# Patient Record
Sex: Female | Born: 1980 | Race: Black or African American | Hispanic: No | Marital: Married | Smoking: Never smoker
Health system: Southern US, Community
[De-identification: ages and names within clinical notes are randomized; demographics above are authoritative.]

## PROBLEM LIST (undated history)

## (undated) DIAGNOSIS — E041 Nontoxic single thyroid nodule: Secondary | ICD-10-CM

## (undated) DIAGNOSIS — D219 Benign neoplasm of connective and other soft tissue, unspecified: Secondary | ICD-10-CM

## (undated) DIAGNOSIS — E039 Hypothyroidism, unspecified: Secondary | ICD-10-CM

## (undated) HISTORY — DX: Nontoxic single thyroid nodule: E04.1

## (undated) HISTORY — PX: MYOMECTOMY: SHX85

## (undated) HISTORY — PX: WISDOM TOOTH EXTRACTION: SHX21

## (undated) HISTORY — PX: HYSTEROSCOPY: SHX211

---

## 2003-04-12 ENCOUNTER — Other Ambulatory Visit: Admission: RE | Admit: 2003-04-12 | Discharge: 2003-04-12 | Payer: Self-pay | Admitting: Obstetrics and Gynecology

## 2004-06-27 ENCOUNTER — Other Ambulatory Visit: Admission: RE | Admit: 2004-06-27 | Discharge: 2004-06-27 | Payer: Self-pay | Admitting: Obstetrics and Gynecology

## 2005-09-02 ENCOUNTER — Other Ambulatory Visit: Admission: RE | Admit: 2005-09-02 | Discharge: 2005-09-02 | Payer: Self-pay | Admitting: Obstetrics and Gynecology

## 2009-12-07 ENCOUNTER — Encounter: Admission: RE | Admit: 2009-12-07 | Discharge: 2009-12-07 | Payer: Self-pay | Admitting: Internal Medicine

## 2010-01-03 ENCOUNTER — Other Ambulatory Visit: Admission: RE | Admit: 2010-01-03 | Discharge: 2010-01-03 | Payer: Self-pay | Admitting: Interventional Radiology

## 2010-01-03 ENCOUNTER — Encounter: Admission: RE | Admit: 2010-01-03 | Discharge: 2010-01-03 | Payer: Self-pay | Admitting: Endocrinology

## 2010-02-06 ENCOUNTER — Ambulatory Visit (HOSPITAL_COMMUNITY): Admission: RE | Admit: 2010-02-06 | Discharge: 2010-02-06 | Payer: Self-pay | Admitting: Obstetrics and Gynecology

## 2010-10-06 LAB — CBC
Hemoglobin: 12.3 g/dL (ref 12.0–15.0)
MCH: 30 pg (ref 26.0–34.0)
MCV: 88.1 fL (ref 78.0–100.0)
RDW: 12.8 % (ref 11.5–15.5)

## 2010-10-10 IMAGING — US US BIOPSY
1 series · 13 of 13 positions shown · non-contrast
Comparison: none

CLINICAL DATA: Dominant right thyroid mass.

ULTRASOUND-GUIDED THYROID ASPIRATION BIOPSY
TECHNIQUE: Survey ultrasound was performed and the dominant lesion
in the right lobe was localized.  An appropriate skin entry site
was determined.  Skin was marked, then prepped with Betadine,
draped in usual sterile fashion, and infiltrated locally with 1%
lidocaine.  Under real-time ultrasound guidance, 4  passes were
made into the lesion with 25 gauge needles.  The patient tolerated
procedure well, with no immediate complications.
IMPRESSION
1.  Technically successful ultrasound-guided thyroid aspiration
biopsy

[Series 1: us biopsy · 0.07mm/px · 13 acquisitions, 13 frames shown]
[im 1/13]
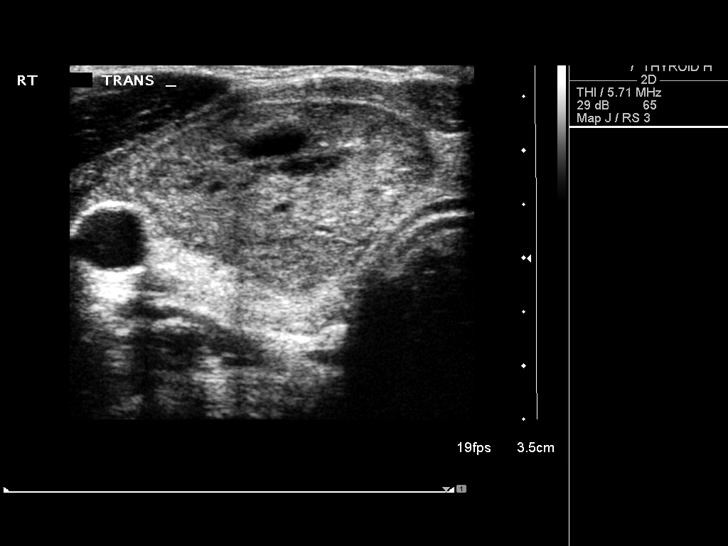
[im 2/13]
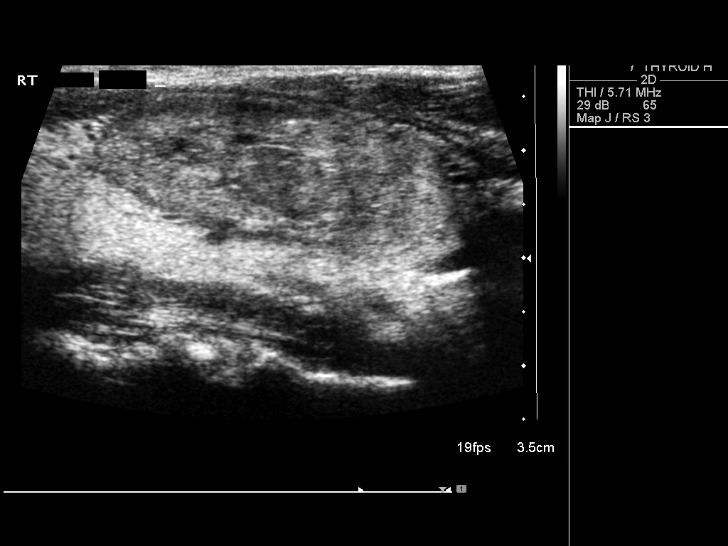
[im 3/13]
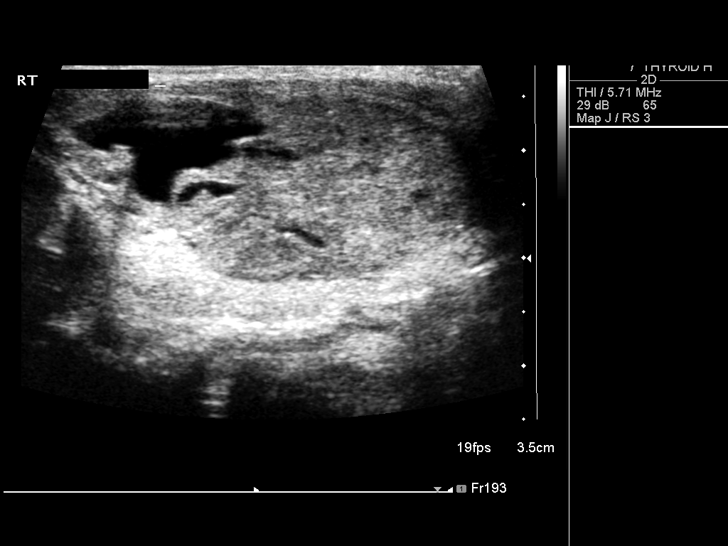
[im 4/13]
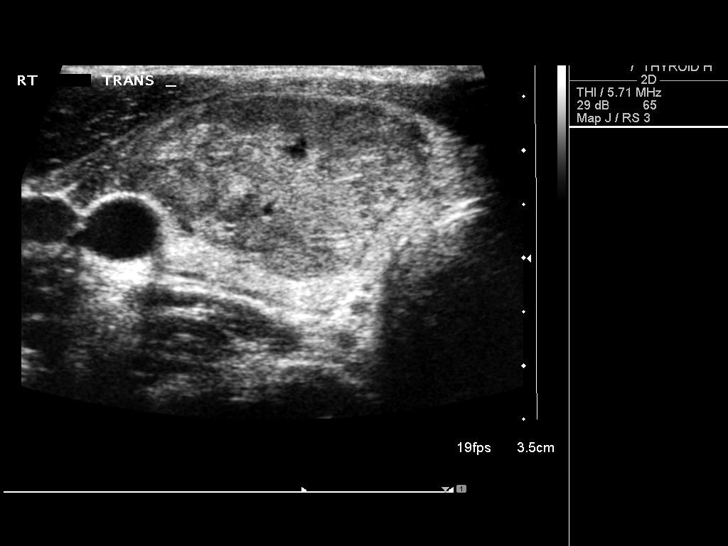
[im 5/13]
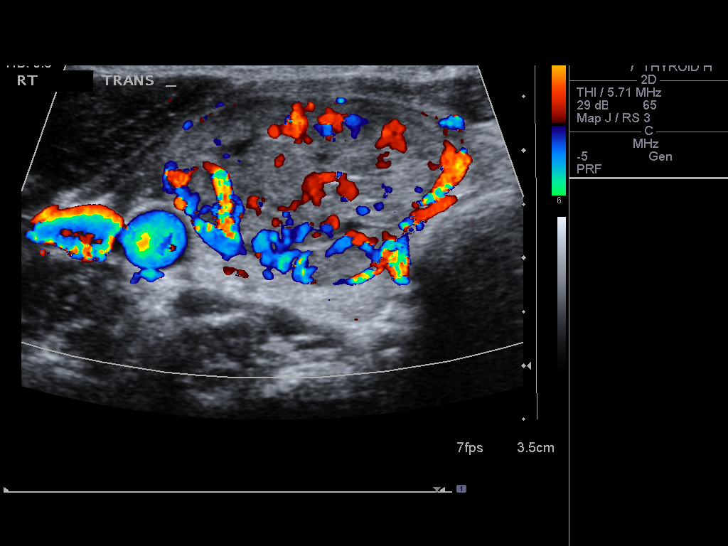
[im 6/13]
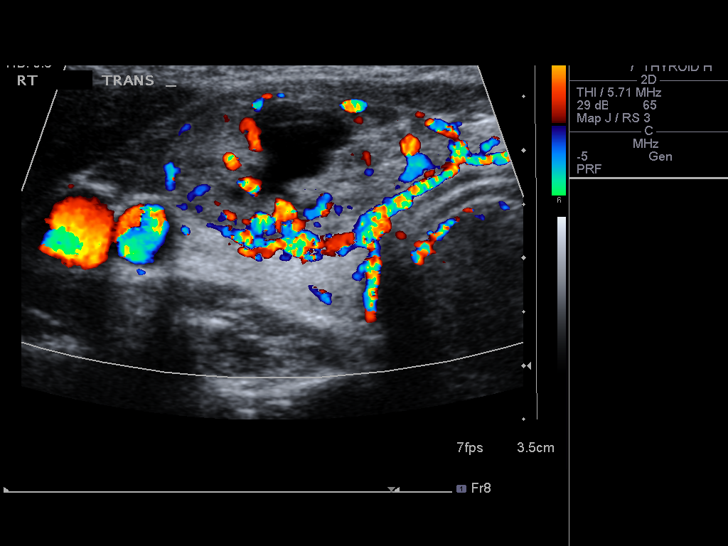
[im 7/13]
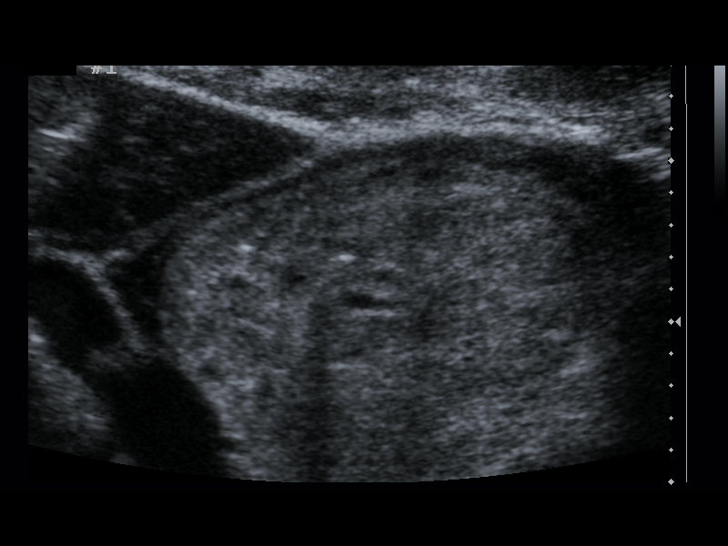
[im 8/13]
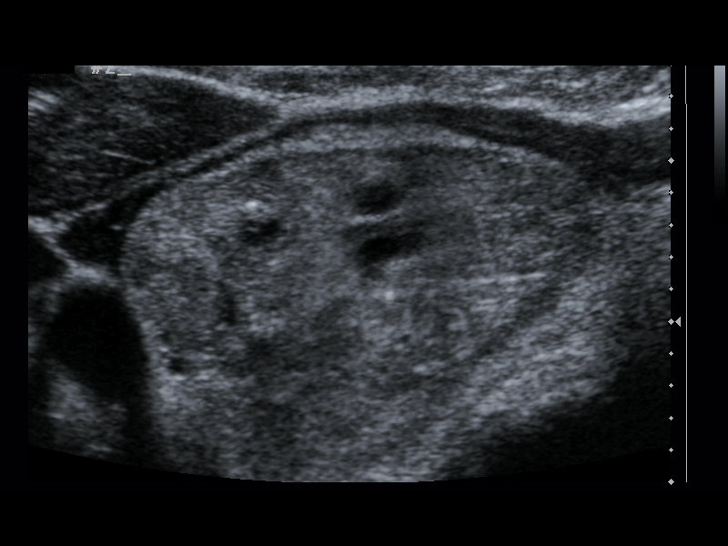
[im 9/13]
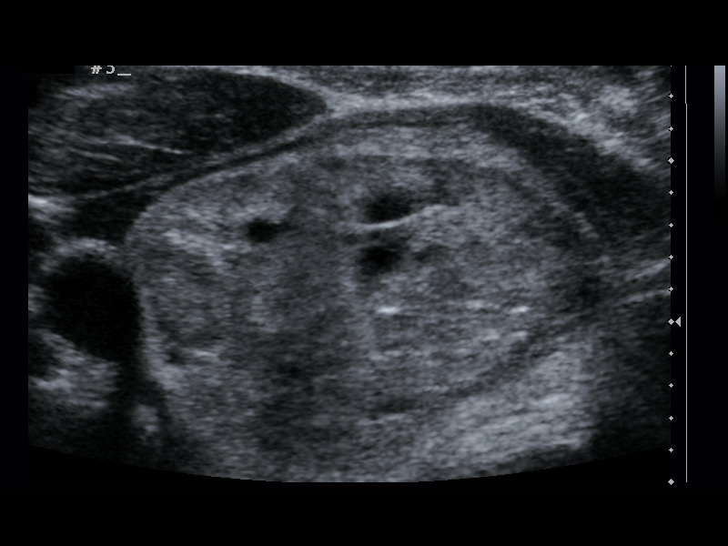
[im 10/13]
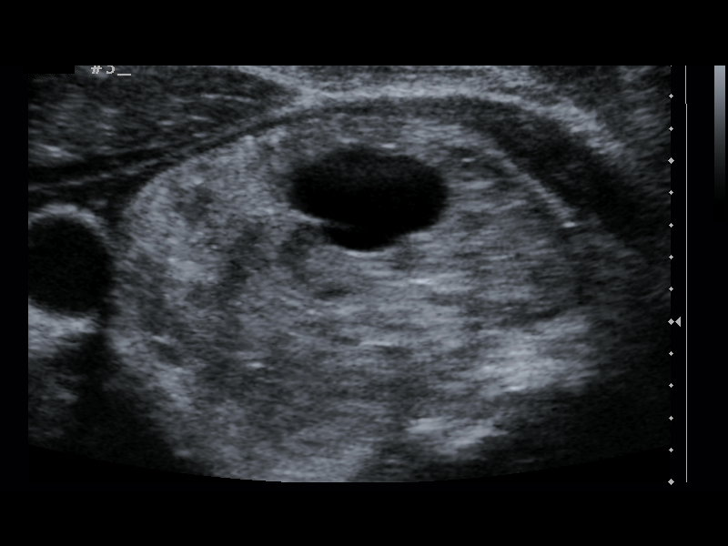
[im 11/13]
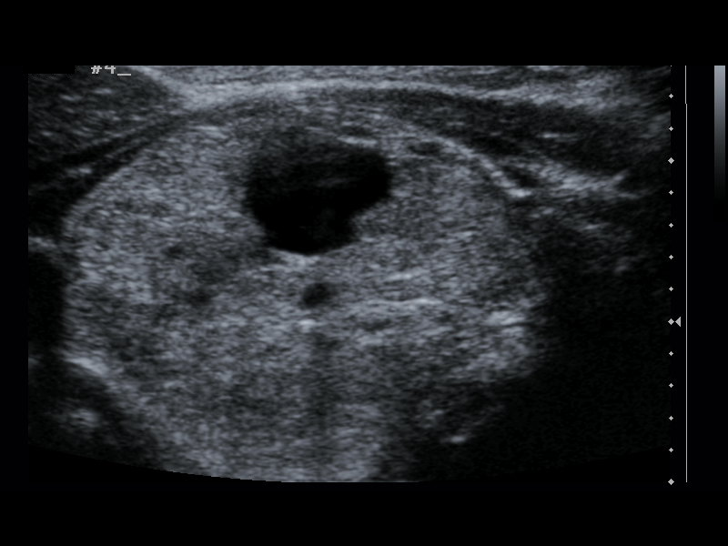
[im 12/13]
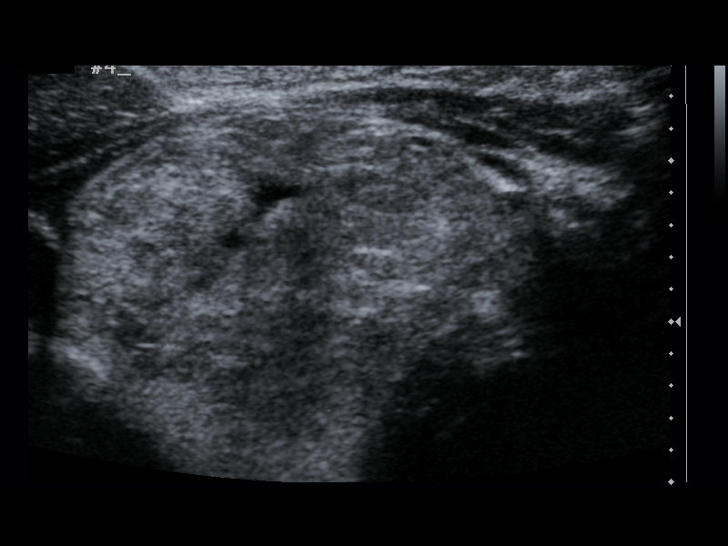
[im 13/13]
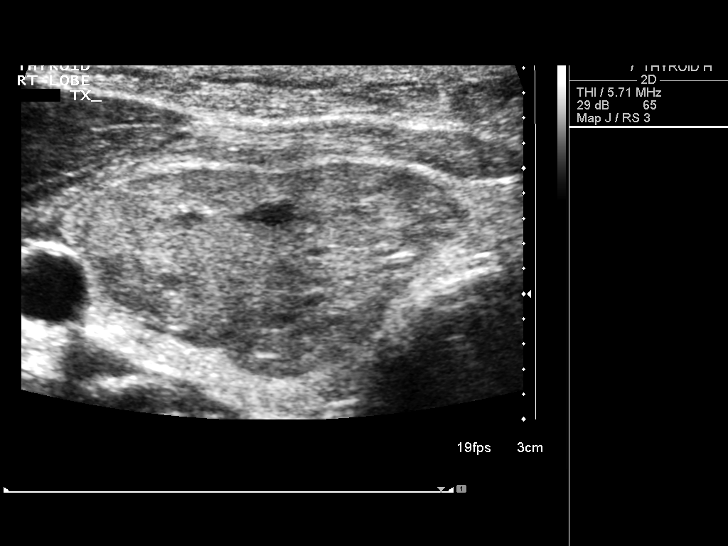

[13 of 13 positions shown; findings below may reference images not displayed]

## 2010-10-30 ENCOUNTER — Other Ambulatory Visit (HOSPITAL_COMMUNITY): Payer: Self-pay | Admitting: Obstetrics and Gynecology

## 2010-10-30 DIAGNOSIS — N979 Female infertility, unspecified: Secondary | ICD-10-CM

## 2010-11-01 ENCOUNTER — Ambulatory Visit (HOSPITAL_COMMUNITY): Admission: RE | Admit: 2010-11-01 | Payer: BLUE CROSS/BLUE SHIELD | Source: Ambulatory Visit

## 2010-11-05 ENCOUNTER — Ambulatory Visit (HOSPITAL_COMMUNITY): Payer: BLUE CROSS/BLUE SHIELD

## 2011-02-05 LAB — CBC
HCT: 38 % (ref 36–46)
Hemoglobin: 13.1 g/dL (ref 12.0–16.0)
Platelets: 266 10*3/uL (ref 150–399)

## 2011-02-05 LAB — ABO/RH: RH Type: POSITIVE

## 2011-02-05 LAB — HIV ANTIBODY (ROUTINE TESTING W REFLEX): HIV: NONREACTIVE

## 2011-02-05 LAB — HEPATITIS B SURFACE ANTIGEN: Hepatitis B Surface Ag: NEGATIVE

## 2011-04-25 ENCOUNTER — Encounter (HOSPITAL_COMMUNITY): Payer: Self-pay | Admitting: *Deleted

## 2011-04-25 ENCOUNTER — Inpatient Hospital Stay (HOSPITAL_COMMUNITY)
Admission: AD | Admit: 2011-04-25 | Discharge: 2011-04-26 | Disposition: A | Discharge status: Home / Self Care | Payer: BC Managed Care – PPO | Source: Ambulatory Visit | Attending: Obstetrics and Gynecology | Admitting: Obstetrics and Gynecology

## 2011-04-25 DIAGNOSIS — O200 Threatened abortion: Secondary | ICD-10-CM | POA: Insufficient documentation

## 2011-04-25 DIAGNOSIS — O479 False labor, unspecified: Secondary | ICD-10-CM

## 2011-04-25 DIAGNOSIS — D219 Benign neoplasm of connective and other soft tissue, unspecified: Secondary | ICD-10-CM

## 2011-04-25 DIAGNOSIS — O3410 Maternal care for benign tumor of corpus uteri, unspecified trimester: Secondary | ICD-10-CM | POA: Insufficient documentation

## 2011-04-25 DIAGNOSIS — D259 Leiomyoma of uterus, unspecified: Secondary | ICD-10-CM | POA: Insufficient documentation

## 2011-04-25 HISTORY — DX: Benign neoplasm of connective and other soft tissue, unspecified: D21.9

## 2011-04-25 HISTORY — DX: Hypothyroidism, unspecified: E03.9

## 2011-04-25 LAB — WET PREP, GENITAL
Trich, Wet Prep: NONE SEEN
Yeast Wet Prep HPF POC: NONE SEEN

## 2011-04-25 LAB — URINALYSIS, ROUTINE W REFLEX MICROSCOPIC
Bilirubin Urine: NEGATIVE
Hgb urine dipstick: NEGATIVE
Ketones, ur: NEGATIVE mg/dL
Protein, ur: NEGATIVE mg/dL
Specific Gravity, Urine: <1.005 — ABNORMAL LOW (ref 1.005–1.030)
pH: 6.5 (ref 5.0–8.0)

## 2011-04-25 MED ORDER — NIFEDIPINE 10 MG PO CAPS: 20.0000 mg | ORAL_CAPSULE | Freq: Once | ORAL | Status: DC

## 2011-04-25 MED ORDER — IBUPROFEN 800 MG PO TABS
800.0000 mg | ORAL_TABLET | Freq: Once | ORAL | Status: AC
  Administered 2011-04-25: 800 mg via ORAL
  Filled 2011-04-25: qty 1

## 2011-04-25 NOTE — Progress Notes (Signed)
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  RESTED PAIN DID NOT GO AWAY- BUT DID SLEEP-    HAS BEEN HAPPENING ALL DAY- HAS LOWER TIGHTENNESS.  CALLED OFFICE AT   11AM -  CALLED BACK IN AFTERNOON- TOLD  TO TAKE 2 TYLENOL AND DRINK WATER.   PAIN IS STILL SAME.

## 2011-04-25 NOTE — Progress Notes (Signed)
Pt in c/o abdominal cramping x24 hours.  Called the office, told to drink water and take tylenol, tried and did not help.  Denies any urinary symptoms other than increased pressure.  + FM.  Denies any bleeding or leaking of fluid.

## 2011-04-25 NOTE — ED Provider Notes (Signed)
History   Hannah Knapp is a 30 y.o. MBF, primagravida at 21.6 wks, who presents reporting lower abdominal, tightening, pulling since about 8pm last night.  Pain is worse with standing.  Had noticed before, but didn't last long and went away; this incident has been persistent over last 24 hrs.  Able to sleep last night, but has noticed pains again today; sometimes constant, but overall, comes and goes.  No VB, abnl d/c, or LOF.  Last IC about 1 week ago.  Called office today and instructed pt to increase water and take 2 Tylenol--pt took around 1600, but no relief; called afterhours and instructed to come in for eval.  Pt works as Associate Professor at [employer]; gets up about every 30 minutes when seeing students to escort them in and out of her office, rest of her time at desk.  No family hx of preterm births.    No chief complaint on file.  HPI  OB History    Grav Para Term Preterm Abortions TAB SAB Ect Mult Living   1               Past Medical History  Diagnosis Date  . Fibroid   . Hypothyroidism     Past Surgical History  Procedure Date  . Hysteroscopy     No family history on file.  History  Substance Use Topics  . Smoking status: Never Smoker   . Smokeless tobacco: Not on file  . Alcohol Use: No    Allergies:  Allergies  Allergen Reactions  . Other Hives and Itching    "Red snapper fish"    Prescriptions prior to admission  Medication Sig Dispense Refill  . acetaminophen (TYLENOL) 325 MG tablet Take 650 mg by mouth every 6 (six) hours as needed. For pain       . ergocalciferol (VITAMIN D2) 50000 UNITS capsule Take 50,000 Units by mouth 2 (two) times a week. Take on Tuesdays and Thursdays       . prenatal vitamin w/FE, FA (PRENATAL 1 + 1) 27-1 MG TABS Take 1 tablet by mouth daily.          Review of Systems  Constitutional: Negative.   Respiratory: Negative.        Cold early September for about "6" days  Cardiovascular: Negative.   Gastrointestinal: Negative.     Last BM this AM; typically daily BM  Genitourinary: Negative.   Musculoskeletal: Negative.   Skin: Negative.   Neurological: Negative.    Physical Exam   Blood pressure 103/68, pulse 90, temperature 99.1 F (37.3 C), temperature source Oral, resp. rate 20, height 5\' 3"  (1.6 m), weight 61.292 kg (135 lb 2 oz).  Physical Exam  Constitutional: She is oriented to person, place, and time. She appears well-developed and well-nourished.       Grimace and guarding lower abdomen with some ctxs; feels urge to void w/ ctxs at times;   Cardiovascular: Normal rate and regular rhythm.   Respiratory: Effort normal and breath sounds normal.  GI: Soft. Bowel sounds are normal.  Genitourinary: Vagina normal.       sm amt white d/c in vault; no lesions; no blood; cx:  L/CL/firm/high  Musculoskeletal: She exhibits no edema.  Neurological: She is alert and oriented to person, place, and time. She has normal reflexes.  Skin: Skin is warm and dry.    MAU Course  Procedures 1.  Wet Prep:  Neg 2.  GC/CT pending 3. U/a:Neg; SG<1.005 4.  TOCO only after initial FHT's and ctxs 4-5 min apart 5.  Motrin 800mg  po x1 at 2125--helped some with discomfort from ctxs, but not frequency overall 6.  Po hydration   Assessment and Plan  1.  IUP at 21.6 2.  Preterm ctxs 3.  large post fibroid =7 cm at 7.5 wk u/s 4.  No s/s of infection 5.  Incomplete anatomy u/s at 19 wks (facial and cardiac views not all seen) 6.  Spacing of ctxs with time after Motrin, but didn't resolve; pt able to sleep after 11pm 7.  Declined tocolytics  1.  Per c/w Dr. Stefano Gaul, Motrin and po fluids given. 2.  Approximately 1.5 hrs after Motrin, c/w dr. Stefano Gaul r/e persisting ctxs, and he rec'd Terbutaline or Procardia, and gave pt options; After discussion of both and how work in body, pt declined and wants to reassess how she feels over next hr or so.  3.  Reassessed pt around MN, ctxs cont'd to space with time, some 2-4, but spaces of  7-8 minutes also noted; Pt requests to go home and be seen at office 04/26/11. 4.  Preterm labor precautions rev'd; pelvic rest; continue Motrin q8hrs x24hrs--Rx called to [facility] [address] 5.  Note written to be OOW until Monday 04/29/11.   Izen Petz H 04/25/2011, 9:34 PM

## 2011-04-26 MED ORDER — IBUPROFEN 800 MG PO TABS: 800.0000 mg | ORAL_TABLET | Freq: Three times a day (TID) | ORAL | Status: AC | PRN

## 2011-07-23 NOTE — L&D Delivery Note (Signed)
Delivery Note At 9:40 AM a viable and healthy female was delivered via Vaginal, Spontaneous Delivery (Presentation: Right Occiput Anterior).  APGAR: 7, 9; weight 8 lb 1.8 oz (3680 g).   Placenta status: Intact, Spontaneous.  Cord: 3 vessels with the following complications: None.  The placenta had an extra lobe. The umbilical cord was inserted at the edge of the largest portion of the placenta. The placenta was sent to pathology.  Anesthesia: Epidural  Episiotomy: None Lacerations: None Suture Repair: NA Est. Blood Loss (mL): 350   Mom to postpartum.   Baby A to nursery-stable.   Baby B to NA.  Hannah Knapp V 09/03/2011, 9:58 AM

## 2011-08-08 LAB — STREP B DNA PROBE
GBS: NEGATIVE
GBS: POSITIVE

## 2011-09-02 ENCOUNTER — Encounter (HOSPITAL_COMMUNITY): Payer: Self-pay | Admitting: Anesthesiology

## 2011-09-02 ENCOUNTER — Inpatient Hospital Stay (HOSPITAL_COMMUNITY)
Admission: RE | Admit: 2011-09-02 | Discharge: 2011-09-05 | DRG: 373 | Disposition: A | Discharge status: Home / Self Care | Payer: BC Managed Care – PPO | Source: Ambulatory Visit | Attending: Obstetrics and Gynecology | Admitting: Obstetrics and Gynecology

## 2011-09-02 ENCOUNTER — Other Ambulatory Visit: Payer: Self-pay | Admitting: Obstetrics and Gynecology

## 2011-09-02 ENCOUNTER — Inpatient Hospital Stay (HOSPITAL_COMMUNITY): Payer: BC Managed Care – PPO | Admitting: Anesthesiology

## 2011-09-02 DIAGNOSIS — Y9210 Unspecified residential institution as the place of occurrence of the external cause: Secondary | ICD-10-CM | POA: Diagnosis not present

## 2011-09-02 DIAGNOSIS — Z22330 Carrier of Group B streptococcus: Secondary | ICD-10-CM

## 2011-09-02 DIAGNOSIS — O34599 Maternal care for other abnormalities of gravid uterus, unspecified trimester: Secondary | ICD-10-CM | POA: Diagnosis present

## 2011-09-02 DIAGNOSIS — O99892 Other specified diseases and conditions complicating childbirth: Principal | ICD-10-CM | POA: Diagnosis present

## 2011-09-02 DIAGNOSIS — D259 Leiomyoma of uterus, unspecified: Secondary | ICD-10-CM | POA: Diagnosis present

## 2011-09-02 DIAGNOSIS — D4959 Neoplasm of unspecified behavior of other genitourinary organ: Secondary | ICD-10-CM | POA: Diagnosis present

## 2011-09-02 DIAGNOSIS — E039 Hypothyroidism, unspecified: Secondary | ICD-10-CM | POA: Diagnosis present

## 2011-09-02 DIAGNOSIS — E079 Disorder of thyroid, unspecified: Secondary | ICD-10-CM | POA: Diagnosis present

## 2011-09-02 DIAGNOSIS — W19XXXA Unspecified fall, initial encounter: Secondary | ICD-10-CM | POA: Diagnosis not present

## 2011-09-02 DIAGNOSIS — O99284 Endocrine, nutritional and metabolic diseases complicating childbirth: Secondary | ICD-10-CM | POA: Diagnosis present

## 2011-09-02 LAB — CBC
MCHC: 33.8 g/dL (ref 30.0–36.0)
MCV: 91.1 fL (ref 78.0–100.0)
Platelets: 178 10*3/uL (ref 150–400)
RDW: 13.2 % (ref 11.5–15.5)
WBC: 9.5 10*3/uL (ref 4.0–10.5)

## 2011-09-02 MED ORDER — PHENYLEPHRINE 40 MCG/ML (10ML) SYRINGE FOR IV PUSH (FOR BLOOD PRESSURE SUPPORT): 80.0000 ug | PREFILLED_SYRINGE | INTRAVENOUS | Status: DC | PRN

## 2011-09-02 MED ORDER — LACTATED RINGERS IV SOLN: 500.0000 mL | Freq: Once | INTRAVENOUS | Status: DC

## 2011-09-02 MED ORDER — OXYCODONE-ACETAMINOPHEN 5-325 MG PO TABS: 1.0000 | ORAL_TABLET | ORAL | Status: DC | PRN

## 2011-09-02 MED ORDER — PENICILLIN G POTASSIUM 5000000 UNITS IJ SOLR
5.0000 10*6.[IU] | Freq: Once | INTRAVENOUS | Status: AC
  Administered 2011-09-02: 5 10*6.[IU] via INTRAVENOUS
  Filled 2011-09-02: qty 5

## 2011-09-02 MED ORDER — EPHEDRINE 5 MG/ML INJ: 10.0000 mg | INTRAVENOUS | Status: DC | PRN

## 2011-09-02 MED ORDER — IBUPROFEN 600 MG PO TABS: 600.0000 mg | ORAL_TABLET | Freq: Four times a day (QID) | ORAL | Status: DC | PRN

## 2011-09-02 MED ORDER — LACTATED RINGERS IV SOLN
INTRAVENOUS | Status: DC
  Administered 2011-09-02 – 2011-09-03 (×3): via INTRAVENOUS

## 2011-09-02 MED ORDER — LIDOCAINE HCL (PF) 1 % IJ SOLN: 30.0000 mL | INTRAMUSCULAR | Status: DC | PRN

## 2011-09-02 MED ORDER — LIDOCAINE HCL (PF) 1 % IJ SOLN
INTRAMUSCULAR | Status: DC | PRN
  Administered 2011-09-02 (×2): 5 mL

## 2011-09-02 MED ORDER — OXYTOCIN BOLUS FROM INFUSION
500.0000 mL | Freq: Once | INTRAVENOUS | Status: AC
  Administered 2011-09-03: 500 mL via INTRAVENOUS
  Filled 2011-09-02: qty 1000
  Filled 2011-09-02: qty 500

## 2011-09-02 MED ORDER — FENTANYL 2.5 MCG/ML BUPIVACAINE 1/10 % EPIDURAL INFUSION (WH - ANES)
INTRAMUSCULAR | Status: DC | PRN
  Administered 2011-09-02: 14 mL/h via EPIDURAL

## 2011-09-02 MED ORDER — FENTANYL 2.5 MCG/ML BUPIVACAINE 1/10 % EPIDURAL INFUSION (WH - ANES)
14.0000 mL/h | INTRAMUSCULAR | Status: DC
  Administered 2011-09-02 – 2011-09-03 (×3): 14 mL/h via EPIDURAL
  Filled 2011-09-02 (×4): qty 60

## 2011-09-02 MED ORDER — BUTORPHANOL TARTRATE 2 MG/ML IJ SOLN: 1.0000 mg | INTRAMUSCULAR | Status: DC | PRN

## 2011-09-02 MED ORDER — PHENYLEPHRINE 40 MCG/ML (10ML) SYRINGE FOR IV PUSH (FOR BLOOD PRESSURE SUPPORT)
80.0000 ug | PREFILLED_SYRINGE | INTRAVENOUS | Status: DC | PRN
  Filled 2011-09-02: qty 5

## 2011-09-02 MED ORDER — FLEET ENEMA 7-19 GM/118ML RE ENEM: 1.0000 | ENEMA | RECTAL | Status: DC | PRN

## 2011-09-02 MED ORDER — EPHEDRINE 5 MG/ML INJ
10.0000 mg | INTRAVENOUS | Status: DC | PRN
  Administered 2011-09-02: 10 mg via INTRAVENOUS
  Filled 2011-09-02: qty 4

## 2011-09-02 MED ORDER — CITRIC ACID-SODIUM CITRATE 334-500 MG/5ML PO SOLN: 30.0000 mL | ORAL | Status: DC | PRN

## 2011-09-02 MED ORDER — OXYTOCIN 20 UNITS IN LACTATED RINGERS INFUSION - SIMPLE: 125.0000 mL/h | Freq: Once | INTRAVENOUS | Status: DC

## 2011-09-02 MED ORDER — PENICILLIN G POTASSIUM 5000000 UNITS IJ SOLR
2.5000 10*6.[IU] | INTRAVENOUS | Status: DC
  Administered 2011-09-02 – 2011-09-03 (×4): 2.5 10*6.[IU] via INTRAVENOUS
  Filled 2011-09-02 (×7): qty 2.5

## 2011-09-02 MED ORDER — DIPHENHYDRAMINE HCL 50 MG/ML IJ SOLN: 12.5000 mg | INTRAMUSCULAR | Status: DC | PRN

## 2011-09-02 MED ORDER — ACETAMINOPHEN 325 MG PO TABS: 650.0000 mg | ORAL_TABLET | ORAL | Status: DC | PRN

## 2011-09-02 MED ORDER — LACTATED RINGERS IV SOLN
500.0000 mL | INTRAVENOUS | Status: DC | PRN
  Administered 2011-09-02 – 2011-09-03 (×3): 300 mL via INTRAVENOUS

## 2011-09-02 MED ORDER — ONDANSETRON HCL 4 MG/2ML IJ SOLN: 4.0000 mg | Freq: Four times a day (QID) | INTRAMUSCULAR | Status: DC | PRN

## 2011-09-02 NOTE — Progress Notes (Addendum)
Hannah Knapp is a 31 y.o. G1P0 at [redacted]w[redacted]d admitted in early labor.  Subjective: No complaints.  Objective: BP 127/77  Pulse 86  Temp(Src) 98.8 F (37.1 C) (Oral)  Resp 20  Ht 5' 2.5" (1.588 m)  Wt 69.4 kg (153 lb)  BMI 27.54 kg/m2  SpO2 100%      FHT:  FHR: 120s-130s bpm, variability: moderate,  accelerations:  Present,  decelerations:  Absent UC:   irregular, every 3-6 minutes SVE:   Dilation: 4.5 Effacement (%): 100 Station: -1 Exam by:: Erline Hau RNC SROM clear fluid  Labs: Lab Results  Component Value Date   WBC 9.5 09/02/2011   HGB 11.8* 09/02/2011   HCT 34.9* 09/02/2011   MCV 91.1 09/02/2011   PLT 178 09/02/2011    Assessment / Plan: Active labor and progressing.  Currently getting epidural.  Fetal status is overall reassuring.  Purcell Nails 09/02/2011, 7:28 PM

## 2011-09-02 NOTE — Progress Notes (Signed)
Hannah Knapp is a 31 y.o. G1P0 at [redacted]w[redacted]d admitted for early labor.   Subjective: No complaints.  Notified by RN of decel which has since recovered with position and and O2.  Objective: BP 106/68  Pulse 87  Temp(Src) 98.9 F (37.2 C) (Oral)  Resp 20  Ht 5' 2.5" (1.588 m)  Wt 69.4 kg (153 lb)  BMI 27.54 kg/m2  SpO2 100%      FHT:  FHR: 130s bpm, variability: moderate,  accelerations:  Present,  decelerations:  Absent UC:   regular, every 3 minutes SVE:   Dilation: 5.5 Effacement (%): 100 Station: 0 Exam by:: Su Hilt, MD IUPC placed with 100 MVUs  Labs: Lab Results  Component Value Date   WBC 9.5 09/02/2011   HGB 11.8* 09/02/2011   HCT 34.9* 09/02/2011   MCV 91.1 09/02/2011   PLT 178 09/02/2011    Assessment / Plan: P0 at 40 3/7wks in labor progressing slowly with less than adequate contractions.  Pt is still declining augmentation with pitocin.  I discussed risks, benefits, alternatives particularly in light of being group b strep positive.  Patient will think about it and let me know if she decides to proceed with augmentation.  Fetal status is overall reassuring.  Pain is adequately controlled with epidural.   Bayler Gehrig Y 09/02/2011, 10:57 PM

## 2011-09-02 NOTE — Progress Notes (Addendum)
Tomeshia C Landry is a 31 y.o. G1P0 at [redacted]w[redacted]d admitted for early labor.  Subjective: No complaints.  Objective: BP 115/80  Pulse 75  Temp(Src) 98.3 F (36.8 C) (Oral)  Resp 20  Ht 5' 2.5" (1.588 m)  Wt 69.4 kg (153 lb)  BMI 27.54 kg/m2  SpO2 100%      FHT:  FHR: 120s - 130s bpm, variability: moderate,  accelerations:  Present,  decelerations:  Absent UC:   irregular, every 10-12 minutes SVE:   Dilation: 3-4 Effacement (%): 90-100 Station: -1 Exam by:: Osborn Coho, MD GBS+  Labs: Lab Results  Component Value Date   WBC 9.5 09/02/2011   HGB 11.8* 09/02/2011   HCT 34.9* 09/02/2011   MCV 91.1 09/02/2011   PLT 178 09/02/2011    Assessment / Plan: P0 at 40 3/7wks admitted in early labor.  Pt declines augmentation, AROM or pain medicine.  Fetal status is overall reassuring. PCN for GBS +.   Purcell Nails 09/02/2011, 7:12 PM

## 2011-09-02 NOTE — Progress Notes (Addendum)
MD at bedside. Pt and MD discussed augmenting labor with pitocin. Pt desires to wait and see what happens. To reassess plan of care around 2200.

## 2011-09-02 NOTE — Anesthesia Procedure Notes (Signed)
Epidural Patient location during procedure: OB Start time: 09/02/2011 6:48 PM  Staffing Anesthesiologist: Suesan Mohrmann A. Performed by: anesthesiologist   Preanesthetic Checklist Completed: patient identified, site marked, surgical consent, pre-op evaluation, timeout performed, IV checked, risks and benefits discussed and monitors and equipment checked  Epidural Patient position: sitting Prep: site prepped and draped and DuraPrep Patient monitoring: continuous pulse ox and blood pressure Approach: midline Injection technique: LOR air  Needle:  Needle type: Tuohy  Needle gauge: 17 G Needle length: 9 cm Needle insertion depth: 4 cm Catheter type: closed end flexible Catheter size: 19 Gauge Catheter at skin depth: 9 cm Test dose: negative and Other  Assessment Events: blood not aspirated, injection not painful, no injection resistance, negative IV test and no paresthesia  Additional Notes Patient identified. Risks and benefits discussed including failed block, incomplete  Pain control, post dural puncture headache, nerve damage, paralysis, blood pressure Changes, nausea, vomiting, reactions to medications-both toxic and allergic and post Partum back pain. All questions were answered. Patient expressed understanding and wished to proceed. Sterile technique was used throughout procedure. Epidural site was Dressed with sterile barrier dressing. No paresthesias, signs of intravascular injection Or signs of intrathecal spread were encountered.  Patient was more comfortable after the epidural was dosed. Please see RN's note for documentation of vital signs and FHR which are stable.

## 2011-09-02 NOTE — Anesthesia Preprocedure Evaluation (Addendum)
Anesthesia Evaluation  Patient identified by MRN, date of birth, ID band Patient awake    Reviewed: Allergy & Precautions, H&P , Patient's Chart, lab work & pertinent test results  Airway Mallampati: III TM Distance: >3 FB Neck ROM: full    Dental No notable dental hx. (+) Teeth Intact   Pulmonary neg pulmonary ROS,  clear to auscultation  Pulmonary exam normal       Cardiovascular neg cardio ROS regular Normal    Neuro/Psych Negative Neurological ROS  Negative Psych ROS   GI/Hepatic negative GI ROS, Neg liver ROS,   Endo/Other  Hypothyroidism   Renal/GU negative Renal ROS  Genitourinary negative   Musculoskeletal   Abdominal Normal abdominal exam  (+)   Peds  Hematology negative hematology ROS (+)   Anesthesia Other Findings   Reproductive/Obstetrics (+) Pregnancy                          Anesthesia Physical Anesthesia Plan  ASA: II  Anesthesia Plan: Epidural   Post-op Pain Management:    Induction:   Airway Management Planned:   Additional Equipment:   Intra-op Plan:   Post-operative Plan:   Informed Consent: I have reviewed the patients History and Physical, chart, labs and discussed the procedure including the risks, benefits and alternatives for the proposed anesthesia with the patient or authorized representative who has indicated his/her understanding and acceptance.     Plan Discussed with: Anesthesiologist and Surgeon  Anesthesia Plan Comments:         Anesthesia Quick Evaluation

## 2011-09-02 NOTE — Progress Notes (Addendum)
Hannah Knapp is a 31 y.o. G1P0 at [redacted]w[redacted]d admitted for early labor.  Subjective: No complaints.  Called by RN secondary to decel after several long contractions.  Fetal tracing recovered spontaneously with position change.   Objective: BP 115/80  Pulse 75  Temp(Src) 98.3 F (36.8 C) (Oral)  Resp 20  Ht 5' 2.5" (1.588 m)  Wt 69.4 kg (153 lb)  BMI 27.54 kg/m2  SpO2 100%      FHT:  FHR: 120s-130s bpm, variability: moderate,  accelerations:  Present,  decelerations:  Absent UC:   Irregular but not picking up well SVE:   Dilation: 3-4 Effacement (%): 90-100 Station: -1 Exam by:: Osborn Coho.MD @ approx @ 2:15pm  Labs: Lab Results  Component Value Date   WBC 9.5 09/02/2011   HGB 11.8* 09/02/2011   HCT 34.9* 09/02/2011   MCV 91.1 09/02/2011   PLT 178 09/02/2011    Assessment / Plan: P0 @ 40 3/7wks in early labor.  Fetal status is overall reassuring.  Pt continues to decline intervention.   Purcell Nails 09/02/2011, 7:18 PM

## 2011-09-02 NOTE — H&P (Signed)
Hannah Knapp is a 31 y.o. female, G1P0 at 46 3/7 weeks, presenting from the office for labor admit--UCs q 3 min x several hours, and cervix 3 cm, 90%, vtx, -1.  Denies leaking or bleeding, reports +FM.  Pregnancy remarkable for: Fibroids Thyroid nodule GBS positive  History of present pregnancy: Patient entered care at 10 weeks.  EDC of  08/30/11 was established by LMP, and in agreement with [place] in the 1st trimester.  Anatomy scan was done at 19 weeks, with normal findings.  Further ultrasounds were done at 24 weeks to f/u fibroids, with stable growth (7 cm and 4.87 cm). She was seen in MAU at 22 weeks for cramping, and was placed on Procardia and Ibuprophen.  At 34 weeks, the new model of care was reviewed with the patient and her husband--they wished to continue to be followed by the MD service.  OB History    Grav Para Term Preterm Abortions TAB SAB Ect Mult Living   1              Past Medical History  Diagnosis Date  . Fibroid   . Hypothyroidism   Thyroid nodule, with biopsy 6/11.  Fibroids diagnosed at 12/2010.    Past Surgical History  Procedure Date  . Hysteroscopy   Wisdom teeth 2007.    Family History: family history is not on file.  MGF with heart disease and HTN.  MGM migraines, fibromyalgia, depression.  MU etoh, drugs.  MGF etoh, smokers.    Social History:  reports that she has never smoked. She does not have any smokeless tobacco history on file. She reports that she does not drink alcohol or use illicit drugs.  FOB, Hannah Knapp, is involved and supportive--he is graduate educated and employed as a Interior and spatial designer.  Patient is graduate educated, employed as an Production designer, theatre/television/film.  Patient is [place], of the [place] faith.    ROS:  Contractions q 3 minutes at office, no leaking or bleeding.  + FM    Blood pressure 114/78, pulse 79, temperature 98.5 F (36.9 C), temperature source Oral, resp. rate 20, height 5' 2.5" (1.588 m), weight 69.4 kg (153 lb).  Chest clear Heart  RRR without murmur Abd gravid, NT Pelvic--3 cm, 90%, vtx, -1 at office by Hannah August, NP Ext WNL  Prenatal labs: ABO, Rh: A/Positive/-- (07/17 0000) Antibody: Negative (07/17 0000) Rubella:  Immune RPR: Nonreactive (07/17 0000)  HBsAg: Negative (07/17 0000)  HIV: Non-reactive (07/17 0000)  GBS: Positive (01/18 0000)  Cultures negative at [facility] Sickle cell test negative. Declined genetic screening  Assessment/Plan: IUP at 40 3/7 weeks Early labor Positive GBS Fibroid uterus  Plan: Admitted to [facility] per consult with Hannah Knapp as attending. Routine MD orders Pain med/epidural prn. GBS Rx with PCN G per standard dosing.  Hannah Knapp 09/02/2011, 1:41 PM

## 2011-09-03 ENCOUNTER — Encounter (HOSPITAL_COMMUNITY): Payer: Self-pay | Admitting: *Deleted

## 2011-09-03 DIAGNOSIS — D259 Leiomyoma of uterus, unspecified: Secondary | ICD-10-CM | POA: Diagnosis present

## 2011-09-03 DIAGNOSIS — Z22330 Carrier of Group B streptococcus: Secondary | ICD-10-CM

## 2011-09-03 MED ORDER — SIMETHICONE 80 MG PO CHEW: 80.0000 mg | CHEWABLE_TABLET | ORAL | Status: DC | PRN

## 2011-09-03 MED ORDER — TETANUS-DIPHTH-ACELL PERTUSSIS 5-2.5-18.5 LF-MCG/0.5 IM SUSP: 0.5000 mL | Freq: Once | INTRAMUSCULAR | Status: DC

## 2011-09-03 MED ORDER — ONDANSETRON HCL 4 MG PO TABS: 4.0000 mg | ORAL_TABLET | ORAL | Status: DC | PRN

## 2011-09-03 MED ORDER — MEDROXYPROGESTERONE ACETATE 150 MG/ML IM SUSP: 150.0000 mg | INTRAMUSCULAR | Status: DC | PRN

## 2011-09-03 MED ORDER — DIBUCAINE 1 % RE OINT: 1.0000 "application " | TOPICAL_OINTMENT | RECTAL | Status: DC | PRN

## 2011-09-03 MED ORDER — IBUPROFEN 600 MG PO TABS
600.0000 mg | ORAL_TABLET | Freq: Four times a day (QID) | ORAL | Status: DC
  Administered 2011-09-03 – 2011-09-05 (×8): 600 mg via ORAL
  Filled 2011-09-03 (×8): qty 1

## 2011-09-03 MED ORDER — ONDANSETRON HCL 4 MG/2ML IJ SOLN: 4.0000 mg | INTRAMUSCULAR | Status: DC | PRN

## 2011-09-03 MED ORDER — ZOLPIDEM TARTRATE 5 MG PO TABS: 5.0000 mg | ORAL_TABLET | Freq: Every evening | ORAL | Status: DC | PRN

## 2011-09-03 MED ORDER — PRENATAL MULTIVITAMIN CH
1.0000 | ORAL_TABLET | Freq: Every day | ORAL | Status: DC
  Administered 2011-09-03 – 2011-09-04 (×2): 1 via ORAL
  Filled 2011-09-03 (×4): qty 1

## 2011-09-03 MED ORDER — SENNOSIDES-DOCUSATE SODIUM 8.6-50 MG PO TABS
2.0000 | ORAL_TABLET | Freq: Every day | ORAL | Status: DC
  Administered 2011-09-03: 1 via ORAL
  Administered 2011-09-04: 2 via ORAL

## 2011-09-03 MED ORDER — OXYCODONE-ACETAMINOPHEN 5-325 MG PO TABS: 1.0000 | ORAL_TABLET | ORAL | Status: DC | PRN

## 2011-09-03 MED ORDER — MEASLES, MUMPS & RUBELLA VAC ~~LOC~~ INJ
0.5000 mL | INJECTION | Freq: Once | SUBCUTANEOUS | Status: DC
  Filled 2011-09-03: qty 0.5

## 2011-09-03 MED ORDER — BENZOCAINE-MENTHOL 20-0.5 % EX AERO
INHALATION_SPRAY | CUTANEOUS | Status: AC
  Filled 2011-09-03: qty 56

## 2011-09-03 MED ORDER — DIPHENHYDRAMINE HCL 25 MG PO CAPS: 25.0000 mg | ORAL_CAPSULE | Freq: Four times a day (QID) | ORAL | Status: DC | PRN

## 2011-09-03 MED ORDER — LANOLIN HYDROUS EX OINT: TOPICAL_OINTMENT | CUTANEOUS | Status: DC | PRN

## 2011-09-03 MED ORDER — LACTATED RINGERS IV SOLN
INTRAVENOUS | Status: DC
  Administered 2011-09-03: 250 mL via INTRAUTERINE

## 2011-09-03 MED ORDER — WITCH HAZEL-GLYCERIN EX PADS: 1.0000 "application " | MEDICATED_PAD | CUTANEOUS | Status: DC | PRN

## 2011-09-03 MED ORDER — TERBUTALINE SULFATE 1 MG/ML IJ SOLN: 0.2500 mg | Freq: Once | INTRAMUSCULAR | Status: DC | PRN

## 2011-09-03 MED ORDER — OXYTOCIN 20 UNITS IN LACTATED RINGERS INFUSION - SIMPLE
1.0000 m[IU]/min | INTRAVENOUS | Status: DC
  Administered 2011-09-03: 2 m[IU]/min via INTRAVENOUS
  Administered 2011-09-03: 1 m[IU]/min via INTRAVENOUS
  Administered 2011-09-03: 2 m[IU]/min via INTRAVENOUS

## 2011-09-03 MED ORDER — BENZOCAINE-MENTHOL 20-0.5 % EX AERO: 1.0000 "application " | INHALATION_SPRAY | CUTANEOUS | Status: DC | PRN

## 2011-09-03 NOTE — Consults (Addendum)
LATE ENTRY:  Called by Steward Drone (house coverage RN) to see patient for complaint of numb right leg.  Patient had SVD at 0940 today after a lengthy labor and 2 hours of pushing with an epidural that was placed at 1840 on 09/02/11.  Patient reports epidural worked very well for labor and that due to being positioned with right side down during labor for fetal reasons, her right leg was heavier and more numb during labor.  She reports that for the last several months she has had weakness in her left leg, but that this right leg complaint is new since the epidural.  Since delivery she has had recovery of strength and sensation in her left leg, and slower recovery of her right leg.  She has been ambulating with assistance, but has noted that her right leg has remained numb and she has required assistance moving it. She has had no difficulty voiding. She also notes tenderness at the epidural puncture site, and all over soreness.  Prior to my evaluation, she had fallen off of the toilet because her right leg "gave out".   She does not appear to have sustained any major injury from this fall.  On exam she has equivalent motor strength in both legs.  Her sensation is normal in her left leg and right foot.  She has slightly decreased sensation to touch in her right calf and decreased sensation in her right thigh.  She notes that this is improved from earlier today.  Examination of her back shows no erythema or swelling.  There is very mild bruising at the epidural puncture site and she is tender to palpation directly over the site.  I discussed at length with the patient and her mother that this is likely delayed recovery from epidural analgesia and we would expect her right leg to continue to recover sensation over time.  I strongly underscored the need for the patient to have a staff member with her every time she gets out of bed until her leg has fully recovered.  I recommended heat or ice to the epidural site for relief of  her discomfort there, and she noted that the ibuprofen she has been taking for postpartum pain has helped.  Will continue to follow.  Jasmine December, MD

## 2011-09-03 NOTE — Progress Notes (Signed)
Patient was assisted ambulatory up to bathroom at 1220 by NT and after toileting and standing to return to bed, right knee buckled and patient fell to floor , braced by NT. Patient hit right knee on floor and left elbow on wall. Assisted back to sitting on commode and then to STEDY to return to bed.Vital signs WNL upon return to bed.  Slight redness noted on  Knee and elbow, no swelling or broken skin noted. Ice offered and declined. Dr. Stefano Gaul notified of occurrence. No new orders obtained. Will continue to monitor patient closely and use STEDY each time to bathroom until heaviness resolved in right thigh.

## 2011-09-03 NOTE — Progress Notes (Signed)
UR Chart review completed.  

## 2011-09-03 NOTE — Progress Notes (Signed)
Hannah Knapp is a 31 Knapp.o. G1P0 at [redacted]w[redacted]d admitted for labor.  Subjective: Pt is agreeable to proceed with pitocin now per RN.  Pt's exam is unchanged.  I discussed starting pitocin with patient and questions answered.  Objective: BP 120/79  Pulse 97  Temp(Src) 98.9 F (37.2 C) (Oral)  Resp 18  Ht 5' 2.5" (1.588 m)  Wt 69.4 kg (153 lb)  BMI 27.54 kg/m2  SpO2 100%      FHT:  FHR: 140s bpm, variability: moderate,  accelerations:  Present,  decelerations:  occas variable UC:   q 2-5 min SVE:   Dilation: 5.5 Effacement (%): 100 Station: 0 Exam by:: Su Hilt, MD Exam by RN @ 1am unchanged   Labs: Lab Results  Component Value Date   WBC 9.5 09/02/2011   HGB 11.8* 09/02/2011   HCT 34.9* 09/02/2011   MCV 91.1 09/02/2011   PLT 178 09/02/2011    Assessment / Plan: P0 @ 40 4/7wks in labor without progress.  There are runs of inadequate contractions and patient is agreeable to proceeding with augmentation with pitocin.  Fetal status is overall reassuring.  Pain controlled with epidural. PCN for GBS positive.   Hannah Knapp 09/03/2011, 1:18 AM

## 2011-09-03 NOTE — Progress Notes (Signed)
S: pt c/o r shoulder and upper back ache/soreness after fall in the bathroom when R leg gave out. Denies hitting her head or twisting her leg or ankle, said she caught herself on the bar somewhat however felt like she twisted to the r side and fell on her right shoulder. Sitting up in bed now with heating pad on back and states that motrin has helped with the pain, though feels like it has gotten a little more sore since falling.  O: complete mobility of shoulders and arms, no visible bruising or swelling A: R leg weakness after SVD with epidural, weakness has been steadily improving P: will CTO and provide pain meds PRN

## 2011-09-03 NOTE — Progress Notes (Signed)
31 y.o. year old female,at [redacted]w[redacted]d gestation.  SUBJECTIVE:  Tired.  OBJECTIVE:  BP 126/98  Pulse 80  Temp(Src) 99.2 F (37.3 C) (Oral)  Resp 16  Ht 5' 2.5" (1.588 m)  Wt 69.4 kg (153 lb)  BMI 27.54 kg/m2  SpO2 100%  Fetal Heart Tones:  Reactive  Contractions:          Every three minutes  Cx: 10/100/+2,+3 Pushing well.  ASSESSMENT:  [redacted]w[redacted]d Weeks Pregnancy  Active Labor  PLAN:  Vaginal delivery.  Leonard Schwartz, M.D.

## 2011-09-03 NOTE — Plan of Care (Signed)
Problem: Phase I Progression Outcomes Goal: OOB as tolerated unless otherwise ordered Outcome: Progressing Pt fell at 1 hr check in bathroom with nurse tech who assisted her to the floor after her Rt leg gave way. Have transported pt on stedy for next trips to bath room until epidural wears off.

## 2011-09-03 NOTE — Progress Notes (Signed)
Patient ID: Hannah Knapp, female   DOB: March 28, 1981, 31 y.o.   MRN: [id number] Called by RN secondary to pt with hyperstimulation.  Pitocin held and fetal heart rate tracing reviewed.  Tracing recovered with good variability and no decels.  Fetal tracing is overall reassuring.  VE by RN 7-8 cms.  Will recheck in an hour an reeval at that time.

## 2011-09-04 LAB — CBC
HCT: 30.1 % — ABNORMAL LOW (ref 36.0–46.0)
MCV: 90.7 fL (ref 78.0–100.0)
Platelets: 134 10*3/uL — ABNORMAL LOW (ref 150–400)
RBC: 3.32 MIL/uL — ABNORMAL LOW (ref 3.87–5.11)
WBC: 13.2 10*3/uL — ABNORMAL HIGH (ref 4.0–10.5)

## 2011-09-04 NOTE — Anesthesia Postprocedure Evaluation (Signed)
  Anesthesia Post-op Note  Patient: Hannah Knapp  Procedure(s) Performed: * Lumbar Epidural for L&D *  Patient Location: Mother/Baby  Anesthesia Type: Epidural  Level of Consciousness: awake, alert  and oriented  Airway and Oxygen Therapy: Patient Spontanous Breathing  Post-op Pain: none  Post-op Assessment: Post-op Vital signs reviewed, Patient's Cardiovascular Status Stable, Respiratory Function Stable, Patent Airway, No signs of Nausea or vomiting, Adequate PO intake, Pain level controlled, No headache, No backache, No residual numbness and No residual motor weakness. Numbness right leg has resolved.  Post-op Vital Signs: Reviewed and stable  Complications: No apparent anesthesia complications

## 2011-09-04 NOTE — Progress Notes (Signed)
Post Partum Day 1 Subjective: voiding and tolerating PO up with assistance since fall last night, feels like R leg is weaker and a little swollen, R shoulder is tender but better with motrin and heat  Objective: Blood pressure 106/74, pulse 80, temperature 97.8 F (36.6 C), temperature source Oral, resp. rate 18, height 5' 2.5" (1.588 m), weight 153 lb (69.4 kg), SpO2 100.00%, unknown if currently breastfeeding.  Physical Exam:  General: alert, cooperative and no distress Lochia: appropriate Uterine Fundus: firm Incision: none DVT Evaluation: Negative Homan's sign. Good range of motion and strength to legs, lifts legs off bed from sitting. No ecchymosis.   Basename 09/04/11 0517 09/02/11 1231  HGB 10.5* 11.8*  HCT 30.1* 34.9*    Assessment/Plan: Pp day 1 post fall Normal involution, lactating Plan for discharge tomorrow, Breastfeeding and Lactation consult plans circ prior to discharge.   LOS: 2 days   Hannah Knapp 09/04/2011, 9:37 AM

## 2011-09-04 NOTE — Anesthesia Postprocedure Evaluation (Signed)
  Anesthesia Post-op Note  Patient: Hannah Knapp  Procedure(s) Performed: * No procedures listed *  Patient Location: PACU and Mother/Baby  Anesthesia Type: Epidural  Level of Consciousness: awake, alert  and oriented  Airway and Oxygen Therapy: Patient Spontanous Breathing  Post-op Pain: none  Post-op Assessment: Post-op Vital signs reviewed, Patient's Cardiovascular Status Stable, Respiratory Function Stable, Patent Airway and No signs of Nausea or vomiting  Post-op Vital Signs: Reviewed and stable  Complications: No apparent anesthesia complications

## 2011-09-04 NOTE — Progress Notes (Signed)
Patient able to bear weight on leg.  Reports no numbness, still feels "a little weak".   Patient wanted to use steady for bathroom.  Will attempt ambulation before shift change if feels better

## 2011-09-04 NOTE — Addendum Note (Signed)
Addendum  created 09/04/11 1447 by Mauricia Area, CRNA   Modules edited:Notes Section

## 2011-09-05 MED ORDER — FLUCONAZOLE 100 MG PO TABS: 150.0000 mg | ORAL_TABLET | Freq: Every day | ORAL | Status: AC

## 2011-09-05 MED ORDER — PRENATAL MULTIVITAMIN CH: 1.0000 | ORAL_TABLET | Freq: Every day | ORAL | Status: DC

## 2011-09-05 MED ORDER — IBUPROFEN 600 MG PO TABS: 600.0000 mg | ORAL_TABLET | Freq: Four times a day (QID) | ORAL | Status: AC | PRN

## 2011-09-05 NOTE — Discharge Summary (Signed)
  Obstetric Discharge Summary Reason for Admission: onset of labor Prenatal Procedures: ultrasound Intrapartum Procedures: spontaneous vaginal delivery Postpartum Procedures: none Complications-Operative and Postpartum: none S: C/O vaginal itching/ H/O yeast infections in the past  O: VSS  A: General: alert, cooperative and no distress       Lochia: appropriate       Uterine Fundus: firm       DVT Evaluation: Negative Homan's sign.   P: plans condoms as PP BCM. RTO x 6 weeks for PP exam. Fluconazole 150mg  1 po today then may repeat x 7 days. Sitz baths prn for comfort.    Temp:  [97.7 F (36.5 C)-98.1 F (36.7 C)] 97.7 F (36.5 C) (02/14 0548) Pulse Rate:  [64-76] 64  (02/14 0548) Resp:  [18] 18  (02/14 0548) BP: (110-127)/(75-85) 127/85 mmHg (02/14 0548) Hemoglobin  Date Value Range Status  09/04/2011 10.5* 12.0-15.0 (g/dL) Final     HCT  Date Value Range Status  09/04/2011 30.1* 36.0-46.0 (%) Final    Hospital Course:  Hospital Course: Admitted in labor. Positive GBS. Progressed to fully dilated. Delivery was performed by A. Stefano Gaul, MD without difficulty. Patient and baby tolerated the procedure without difficulty, with a laceration noted. Infant to FTN. Mother and infant then had an uncomplicated postpartum course, with breast feeding going well. Mom's physical exam was WNL, and she was discharged home in stable condition. Contraception plan was condoms.  She received adequate benefit from po pain medications.  Discharge Diagnoses: Term Pregnancy-delivered  Discharge Information: Date: 09/05/2011 Activity: nothing in vagina x 6 weeks Diet: routine Medications:  Medication List  As of 09/05/2011  9:11 AM   START taking these medications         ibuprofen 600 MG tablet   Commonly known as: ADVIL,MOTRIN   Take 1 tablet (600 mg total) by mouth every 6 (six) hours as needed for pain.         CONTINUE taking these medications         acetaminophen 325 MG tablet   Commonly known as: TYLENOL      hydrocortisone cream 1 %      prenatal multivitamin Tabs   Take 1 tablet by mouth at bedtime.         STOP taking these medications         ergocalciferol 50000 UNITS capsule      ferrous sulfate 325 (65 FE) MG tablet          Where to get your medications    These are the prescriptions that you need to pick up.   You may get these medications from any pharmacy.         ibuprofen 600 MG tablet   prenatal multivitamin Tabs           Condition: stable Instructions: refer to practice specific booklet Discharge to: home Follow-up Information    Follow up with [facility]. Schedule an appointment as soon as possible for a visit in 6 weeks.         Newborn Data: Live born  Information for the patient's newborn:  Genice, Kimberlin Baylea [[id number]]  female   Kizzie Fantasia CORI 09/05/2011, 9:11 AM

## 2011-09-05 NOTE — Discharge Instructions (Signed)
Vaginal Delivery Care After  Change your pad on each trip to the bathroom.   Wipe gently with toilet paper during your hospital stay. Always wipe from front to back. A spray bottle with warm tap water could also be used or a towelette if available.   Place your soiled pad and toilet paper in a bathroom wastebasket with a plastic bag liner.   During your hospital stay, save any clots. If you pass a clot while on the toilet, do not flush it. Also, if your vaginal flow seems excessive to you, notify nursing personnel.   The first time you get out of bed after delivery, wait for assistance from a nurse. Do not get up alone at any time if you feel weak or dizzy.   Bend and extend your ankles forcefully so that you feel the calves of your legs get hard. Do this 6 times every hour when you are in bed and awake.   Do not sit with one foot under you, dangle your legs over the edge of the bed, or maintain a position that hinders the circulation in your legs.   Many women experience after pains for 2 to 3 days after delivery. These after pains are mild uterine contractions. Ask the nurse for a pain medication if you need something for this. Sometimes breastfeeding stimulates after pains; if you find this to be true, ask for the medication  -  hour before the next feeding.   For you and your infant's protection, do not go beyond the door(s) of the obstetric unit. Do not carry your baby in your arms in the hallway. When taking your baby to and from your room, put your baby in the bassinet and push the bassinet.   Mothers may have their babies in their room as much as they desire.   Breastfeeding BENEFITS OF BREASTFEEDING For the baby  The first milk (colostrum) helps the baby's digestive system function better.   There are antibodies from the mother in the milk that help the baby fight off infections.   The baby has a lower incidence of asthma, allergies, and SIDS (sudden infant death syndrome).     The nutrients in breast milk are better than formulas for the baby and helps the baby's brain grow better.   Babies who breastfeed have less gas, colic, and constipation.  For the mother  Breastfeeding helps develop a very special bond between mother and baby.   It is more convenient, always available at the correct temperature and cheaper than formula feeding.   It burns calories in the mother and helps with losing weight that was gained during pregnancy.   It makes the uterus contract back down to normal size faster and slows bleeding following delivery.   Breastfeeding mothers have a lower risk of developing breast cancer.  NURSE FREQUENTLY  A healthy, full-term baby may breastfeed as often as every hour or space his or her feedings to every 3 hours.   How often to nurse will vary from baby to baby. Watch your baby for signs of hunger, not the clock.   Nurse as often as the baby requests, or when you feel the need to reduce the fullness of your breasts.   Awaken the baby if it has been 3 to 4 hours since the last feeding.   Frequent feeding will help the mother make more milk and will prevent problems like sore nipples and engorgement of the breasts.  BABY'S POSITION AT THE BREAST    Whether lying down or sitting, be sure that the baby's tummy is facing your tummy.   Support the breast with 4 fingers underneath the breast and the thumb above. Make sure your fingers are well away from the nipple and baby's mouth.   Stroke the baby's lips and cheek closest to the breast gently with your finger or nipple.   When the baby's mouth is open wide enough, place all of your nipple and as much of the dark area around the nipple as possible into your baby's mouth.   Pull the baby in close so the tip of the nose and the baby's cheeks touch the breast during the feeding.  FEEDINGS  The length of each feeding varies from baby to baby and from feeding to feeding.   The baby must suck  about 2 to 3 minutes for your milk to get to him or her. This is called a "let down." For this reason, allow the baby to feed on each breast as long as he or she wants. Your baby will end the feeding when he or she has received the right balance of nutrients.   To break the suction, put your finger into the corner of the baby's mouth and slide it between his or her gums before removing your breast from his or her mouth. This will help prevent sore nipples.  REDUCING BREAST ENGORGEMENT  In the first week after your baby is born, you may experience signs of breast engorgement. When breasts are engorged, they feel heavy, warm, full, and may be tender to the touch. You can reduce engorgement if you:   Nurse frequently, every 2 to 3 hours. Mothers who breastfeed early and often have fewer problems with engorgement.   Place light ice packs on your breasts between feedings. This reduces swelling. Wrap the ice packs in a lightweight towel to protect your skin.   Apply moist hot packs to your breast for 5 to 10 minutes before each feeding. This increases circulation and helps the milk flow.   Gently massage your breast before and during the feeding.   Make sure that the baby empties at least one breast at every feeding before switching sides.   Use a breast pump to empty the breasts if your baby is sleepy or not nursing well. You may also want to pump if you are returning to work or or you feel you are getting engorged.   Avoid bottle feeds, pacifiers or supplemental feedings of water or juice in place of breastfeeding.   Be sure the baby is latched on and positioned properly while breastfeeding.   Prevent fatigue, stress, and anemia.   Wear a supportive bra, avoiding underwire styles.   Eat a balanced diet with enough fluids.  If you follow these suggestions, your engorgement should improve in 24 to 48 hours. If you are still experiencing difficulty, call your lactation consultant or  caregiver. IS MY BABY GETTING ENOUGH MILK? Sometimes, mothers worry about whether their babies are getting enough milk. You can be assured that your baby is getting enough milk if:  The baby is actively sucking and you hear swallowing.   The baby nurses at least 8 to 12 times in a 24 hour time period. Nurse your baby until he or she unlatches or falls asleep at the first breast (at least 10 to 20 minutes), then offer the second side.   The baby is wetting 5 to 6 disposable diapers (6 to 8 cloth diapers) in a   24 hour period by 5 to 6 days of age.   The baby is having at least 2 to 3 stools every 24 hours for the first few months. Breast milk is all the food your baby needs. It is not necessary for your baby to have water or formula. In fact, to help your breasts make more milk, it is best not to give your baby supplemental feedings during the early weeks.   The stool should be soft and yellow.   The baby should gain 4 to 7 ounces per week after he is 4 days old.  TAKE CARE OF YOURSELF Take care of your breasts by:  Bathing or showering daily.   Avoiding the use of soaps on your nipples.   Start feedings on your left breast at one feeding and on your right breast at the next feeding.   You will notice an increase in your milk supply 2 to 5 days after delivery. You may feel some discomfort from engorgement, which makes your breasts very firm and often tender. Engorgement "peaks" out within 24 to 48 hours. In the meantime, apply warm moist towels to your breasts for 5 to 10 minutes before feeding. Gentle massage and expression of some milk before feeding will soften your breasts, making it easier for your baby to latch on. Wear a well fitting nursing bra and air dry your nipples for 10 to 15 minutes after each feeding.   Only use cotton bra pads.   Only use pure lanolin on your nipples after nursing. You do not need to wash it off before nursing.  Take care of yourself by:   Eating  well-balanced meals and nutritious snacks.   Drinking milk, fruit juice, and water to satisfy your thirst (about 8 glasses a day).   Getting plenty of rest.   Increasing calcium in your diet (1200 mg a day).   Avoiding foods that you notice affect the baby in a bad way.  SEEK MEDICAL CARE IF:   You have any questions or difficulty with breastfeeding.   You need help.   You have a hard, red, sore area on your breast, accompanied by a fever of 100.5 F (38.1 C) or more.   Your baby is too sleepy to eat well or is having trouble sleeping.   Your baby is wetting less than 6 diapers per day, by 5 days of age.   Your baby's skin or white part of his or her eyes is more yellow than it was in the hospital.   You feel depressed.  Document Released: 07/08/2005 Document Revised: 03/20/2011 Document Reviewed: 02/20/2009 Exit[employer] Patient Information 2012 [employer]. 

## 2011-10-03 ENCOUNTER — Encounter (HOSPITAL_COMMUNITY): Payer: BC Managed Care – PPO

## 2011-10-17 ENCOUNTER — Ambulatory Visit: Payer: Self-pay | Admitting: Obstetrics and Gynecology

## 2011-10-22 ENCOUNTER — Ambulatory Visit: Payer: Self-pay | Admitting: Obstetrics and Gynecology

## 2011-10-22 ENCOUNTER — Ambulatory Visit (INDEPENDENT_AMBULATORY_CARE_PROVIDER_SITE_OTHER): Payer: BC Managed Care – PPO | Admitting: Obstetrics and Gynecology

## 2012-01-08 ENCOUNTER — Encounter: Payer: Self-pay | Admitting: Obstetrics and Gynecology

## 2012-01-14 ENCOUNTER — Encounter: Payer: Self-pay | Admitting: Obstetrics and Gynecology

## 2012-01-14 ENCOUNTER — Ambulatory Visit (INDEPENDENT_AMBULATORY_CARE_PROVIDER_SITE_OTHER): Payer: BC Managed Care – PPO | Admitting: Obstetrics and Gynecology

## 2012-01-14 VITALS — BP 102/62 | Resp 16 | Ht 62.5 in | Wt 135.0 lb

## 2012-01-14 DIAGNOSIS — Z124 Encounter for screening for malignant neoplasm of cervix: Secondary | ICD-10-CM

## 2012-01-14 DIAGNOSIS — Z01419 Encounter for gynecological examination (general) (routine) without abnormal findings: Secondary | ICD-10-CM

## 2012-01-14 NOTE — Progress Notes (Signed)
Regular Periods: yes Mammogram: no  Monthly Breast Ex.: no Exercise: yes  Tetanus < 10 years: yes Seatbelts: yes  NI. Bladder Functn.: yes Abuse at home: no  Daily BM's: yes Stressful Work: no "At home with baby"  Healthy Diet: yes Sigmoid-Colonoscopy: per pt never.  Calcium: no Medical problems this year: Still having night sweats.   LAST PAP:02/08/2009 "WNL"  Contraception: Condoms  Mammogram:  Per pt never  PCP: Dr.robin Lelon Perla [facility].  PMH: No Changes  FMH: no Changes  Last Bone Scan: per pt never.  ANNUAL GYNECOLOGIC EXAMINATION   Hannah Knapp is a 31 y.o. female, G1P1001, who presents for an annual exam. See above. She had a vaginal delivery of a normal female infant in February 2013.  All are doing well.  She goes back to work next week.  Prior Hysterectomy: No    History   Social History  . Marital Status: Married    Spouse Name: N/A    Number of Children: N/A  . Years of Education: N/A   Social History Main Topics  . Smoking status: Never Smoker   . Smokeless tobacco: Never Used  . Alcohol Use: No  . Drug Use: No  . Sexually Active: Yes    Birth Control/ Protection: None   Other Topics Concern  . None   Social History Narrative  . None    Menstrual cycle:   LMP: Patient's last menstrual period was 12/30/2011.           Cycle: Regular, monthly with normal flow and no severe dysmenorrha  The following portions of the patient's history were reviewed and updated as appropriate: allergies, current medications, past family history, past medical history, past social history, past surgical history and problem list.  Review of Systems Pertinent items are noted in HPI. Breast:Negative for breast lump,nipple discharge or nipple retraction Gastrointestinal: Negative for abdominal pain, change in bowel habits or rectal bleeding Urinary:negative   Objective:    BP 102/62  Resp 16  Ht 5' 2.5" (1.588 m)  Wt 135 lb (61.236 kg)  BMI 24.30  kg/m2  LMP 12/30/2011  Breastfeeding? No    Weight:  Wt Readings from Last 1 Encounters:  01/14/12 135 lb (61.236 kg)          BMI: Body mass index is 24.30 kg/(m^2).  General Appearance: Alert, appropriate appearance for age. No acute distress HEENT: Grossly normal Neck / Thyroid: Supple, no masses, nodes or enlargement Lungs: clear to auscultation bilaterally Back: No CVA tenderness Breast Exam: No masses or nodes.No dimpling, nipple retraction or discharge. Cardiovascular: Regular rate and rhythm. S1, S2, no murmur Gastrointestinal: Soft, non-tender, no masses or organomegaly  ++++++++++++++++++++++++++++++++++++++++++++++++++++++++  Pelvic Exam: External genitalia: normal general appearance Vaginal: normal without tenderness, induration or masses and relaxation: No Cervix: normal appearance Adnexa: normal bimanual exam Uterus: normal size, shape, and consistency Rectovaginal: not indicated  ++++++++++++++++++++++++++++++++++++++++++++++++++++++++  Lymphatic Exam: Non-palpable nodes in neck, clavicular, axillary, or inguinal regions Neurologic: Normal speech, no tremor  Psychiatric: Alert and oriented, appropriate affect.   Wet Prep:   not applicable Urinalysis:  not applicable UPT:           Not done   Assessment:    Normal gyn exam   Overweight or obese: No   Pelvic relaxation: No  Enjoying motherhood   Plan:    pap smear return annually or prn Contraception:condoms    Medications prescribed: none  STD screen request: none  RPR: No.   HBsAg:  No.  Hepatitis C: No.  The updated Pap smear screening guidelines were discussed with the patient. The patient requested that I obtain a Pap smear: Yes.  Kegel exercises discussed: No.  Proper diet and regular exercise were reviewed.  Annual mammograms recommended starting at age 37. Proper breast care was discussed.  Screening colonoscopy is recommended beginning at age 39.  Regular health maintenance  was reviewed.  Sleep hygiene was discussed.  Adequate calcium and vitamin D intake was emphasized.  Mylinda Latina.D.

## 2012-01-15 LAB — PAP IG W/ RFLX HPV ASCU

## 2012-11-25 ENCOUNTER — Other Ambulatory Visit: Payer: Self-pay | Admitting: Endocrinology

## 2012-11-25 DIAGNOSIS — E041 Nontoxic single thyroid nodule: Secondary | ICD-10-CM

## 2012-12-03 ENCOUNTER — Ambulatory Visit
Admission: RE | Admit: 2012-12-03 | Discharge: 2012-12-03 | Disposition: A | Discharge status: Home / Self Care | Payer: BC Managed Care – PPO | Source: Ambulatory Visit | Attending: Endocrinology | Admitting: Endocrinology

## 2012-12-03 DIAGNOSIS — E041 Nontoxic single thyroid nodule: Secondary | ICD-10-CM

## 2012-12-22 ENCOUNTER — Other Ambulatory Visit: Payer: Self-pay | Admitting: Endocrinology

## 2012-12-22 DIAGNOSIS — E041 Nontoxic single thyroid nodule: Secondary | ICD-10-CM

## 2012-12-29 ENCOUNTER — Other Ambulatory Visit: Payer: BC Managed Care – PPO

## 2013-01-07 ENCOUNTER — Ambulatory Visit
Admission: RE | Admit: 2013-01-07 | Discharge: 2013-01-07 | Disposition: A | Discharge status: Home / Self Care | Payer: BC Managed Care – PPO | Source: Ambulatory Visit | Attending: Endocrinology | Admitting: Endocrinology

## 2013-01-07 ENCOUNTER — Other Ambulatory Visit (HOSPITAL_COMMUNITY)
Admission: RE | Admit: 2013-01-07 | Discharge: 2013-01-07 | Disposition: A | Discharge status: Home / Self Care | Payer: BC Managed Care – PPO | Source: Ambulatory Visit | Attending: Diagnostic Radiology | Admitting: Diagnostic Radiology

## 2013-01-07 DIAGNOSIS — E041 Nontoxic single thyroid nodule: Secondary | ICD-10-CM

## 2013-01-07 DIAGNOSIS — E049 Nontoxic goiter, unspecified: Secondary | ICD-10-CM | POA: Insufficient documentation

## 2014-05-02 LAB — OB RESULTS CONSOLE RUBELLA ANTIBODY, IGM: RUBELLA: IMMUNE

## 2014-05-02 LAB — OB RESULTS CONSOLE ABO/RH: RH TYPE: POSITIVE

## 2014-05-02 LAB — OB RESULTS CONSOLE GC/CHLAMYDIA
Chlamydia: NEGATIVE
Gonorrhea: NEGATIVE

## 2014-05-02 LAB — OB RESULTS CONSOLE ANTIBODY SCREEN: Antibody Screen: NEGATIVE

## 2014-05-02 LAB — OB RESULTS CONSOLE HEPATITIS B SURFACE ANTIGEN: HEP B S AG: NEGATIVE

## 2014-05-02 LAB — OB RESULTS CONSOLE HIV ANTIBODY (ROUTINE TESTING): HIV: NONREACTIVE

## 2014-05-02 LAB — OB RESULTS CONSOLE RPR: RPR: NONREACTIVE

## 2014-05-23 ENCOUNTER — Encounter: Payer: Self-pay | Admitting: Obstetrics and Gynecology

## 2014-07-22 NOTE — L&D Delivery Note (Addendum)
Final Labor Progress Note At 0800 Dr. Jillyn Hidden at that the bedside for epidural placement, the pt reports an increased in rectal pressure.  VE C/C/+2.  FHR remained reassuring with occasional variable decelerations.  Vaginal Delivery Note The pt utilized IV pain medication x1 as pain management.   Spontaneous rupture of membranes today, at 0545, clear.  GBS was positive, AMP x 1 doses were given.  Cervical dilation was complete at  0803.  NICHD Category 2.    Pushing with guidance began at  0806.   After 7 minutes of pushing the head, shoulders and the body of a viable female infant "[name]" delivered spontaneously with maternal effort in the ROA position at [place].   Cord around right ankle x2, easily removed.   With vigorous tone and spontaneous cry, the infant was placed on moms abd.  After the umbilical cord was clamped it was cut by the FOB, then cord blood was obtained for evaluation.  Delayed decent of the placenta, Dr Nelda Marseille call to the bedside.  Manual removal of a intact placenta with a 3 vessel cord via [place] at  [phone].   Episiotomy: None   The vulva, perineum, vaginal vault, rectum and cervix were inspected superficial bilateral labial ( hemostatic, not repaired)   Postpartum pitocin as ordered.  Fundus firm, lochia minimum, bleeding under control. EBL 206, Pt hemodynamically stable.   Sponge, laps and needle count correct and verified with the primary care nurse.  Attending MD available at all times.    Routine postpartum orders   Mother unsure about method of contraception  Infant to have in patient circumcision   Placenta to pathology: NO     Cord Gases sent to lab: NO Cord blood sent to lab: YES   APGARS:  8 at 1 minute and 9 at 5 minutes Weight:. pending     Both mom and baby were left in stable condition      Hannah Knapp, CNM, MSN 12/19/2014. 9:16 AM

## 2014-11-15 LAB — OB RESULTS CONSOLE GBS: GBS: POSITIVE

## 2014-12-14 ENCOUNTER — Other Ambulatory Visit: Payer: Self-pay | Admitting: Endocrinology

## 2014-12-14 DIAGNOSIS — E041 Nontoxic single thyroid nodule: Secondary | ICD-10-CM

## 2014-12-19 ENCOUNTER — Encounter (HOSPITAL_COMMUNITY): Payer: Self-pay

## 2014-12-19 ENCOUNTER — Inpatient Hospital Stay (HOSPITAL_COMMUNITY)
Admission: AD | Admit: 2014-12-19 | Discharge: 2014-12-21 | DRG: 775 | Disposition: A | Discharge status: Home / Self Care | Payer: BC Managed Care – PPO | Source: Ambulatory Visit | Attending: Obstetrics & Gynecology | Admitting: Obstetrics & Gynecology

## 2014-12-19 ENCOUNTER — Inpatient Hospital Stay (HOSPITAL_COMMUNITY): Payer: BC Managed Care – PPO | Admitting: Anesthesiology

## 2014-12-19 DIAGNOSIS — Z3A40 40 weeks gestation of pregnancy: Secondary | ICD-10-CM | POA: Diagnosis present

## 2014-12-19 DIAGNOSIS — O99824 Streptococcus B carrier state complicating childbirth: Secondary | ICD-10-CM | POA: Diagnosis present

## 2014-12-19 DIAGNOSIS — Z889 Allergy status to unspecified drugs, medicaments and biological substances status: Secondary | ICD-10-CM

## 2014-12-19 DIAGNOSIS — O480 Post-term pregnancy: Principal | ICD-10-CM | POA: Diagnosis present

## 2014-12-19 DIAGNOSIS — O99284 Endocrine, nutritional and metabolic diseases complicating childbirth: Secondary | ICD-10-CM | POA: Diagnosis present

## 2014-12-19 DIAGNOSIS — Z881 Allergy status to other antibiotic agents status: Secondary | ICD-10-CM | POA: Diagnosis not present

## 2014-12-19 DIAGNOSIS — N858 Other specified noninflammatory disorders of uterus: Secondary | ICD-10-CM | POA: Diagnosis present

## 2014-12-19 DIAGNOSIS — E041 Nontoxic single thyroid nodule: Secondary | ICD-10-CM | POA: Diagnosis present

## 2014-12-19 DIAGNOSIS — E039 Hypothyroidism, unspecified: Secondary | ICD-10-CM | POA: Diagnosis present

## 2014-12-19 DIAGNOSIS — O3429 Maternal care due to uterine scar from other previous surgery: Secondary | ICD-10-CM | POA: Diagnosis present

## 2014-12-19 DIAGNOSIS — J3081 Allergic rhinitis due to animal (cat) (dog) hair and dander: Secondary | ICD-10-CM

## 2014-12-19 DIAGNOSIS — Z9889 Other specified postprocedural states: Secondary | ICD-10-CM

## 2014-12-19 DIAGNOSIS — Z91013 Allergy to seafood: Secondary | ICD-10-CM | POA: Diagnosis not present

## 2014-12-19 DIAGNOSIS — B951 Streptococcus, group B, as the cause of diseases classified elsewhere: Secondary | ICD-10-CM

## 2014-12-19 DIAGNOSIS — Z91048 Other nonmedicinal substance allergy status: Secondary | ICD-10-CM | POA: Diagnosis not present

## 2014-12-19 DIAGNOSIS — Z9109 Other allergy status, other than to drugs and biological substances: Secondary | ICD-10-CM

## 2014-12-19 LAB — ABO/RH: ABO/RH(D): A POS

## 2014-12-19 LAB — CBC
HCT: 38.2 % (ref 36.0–46.0)
Hemoglobin: 13.5 g/dL (ref 12.0–15.0)
MCH: 31.3 pg (ref 26.0–34.0)
MCHC: 35.3 g/dL (ref 30.0–36.0)
MCV: 88.4 fL (ref 78.0–100.0)
PLATELETS: 188 10*3/uL (ref 150–400)
RBC: 4.32 MIL/uL (ref 3.87–5.11)
RDW: 13.4 % (ref 11.5–15.5)
WBC: 11.8 10*3/uL — ABNORMAL HIGH (ref 4.0–10.5)

## 2014-12-19 LAB — TYPE AND SCREEN
ABO/RH(D): A POS
Antibody Screen: NEGATIVE

## 2014-12-19 LAB — RPR: RPR Ser Ql: NONREACTIVE

## 2014-12-19 MED ORDER — FLEET ENEMA 7-19 GM/118ML RE ENEM: 1.0000 | ENEMA | RECTAL | Status: DC | PRN

## 2014-12-19 MED ORDER — ACETAMINOPHEN 325 MG PO TABS: 650.0000 mg | ORAL_TABLET | ORAL | Status: DC | PRN

## 2014-12-19 MED ORDER — SODIUM CHLORIDE 0.9 % IV SOLN
2.0000 g | Freq: Once | INTRAVENOUS | Status: AC
  Administered 2014-12-19: 2 g via INTRAVENOUS
  Filled 2014-12-19: qty 2000

## 2014-12-19 MED ORDER — LACTATED RINGERS IV SOLN
500.0000 mL | INTRAVENOUS | Status: DC | PRN
  Administered 2014-12-19: 500 mL via INTRAVENOUS

## 2014-12-19 MED ORDER — OXYCODONE-ACETAMINOPHEN 5-325 MG PO TABS
1.0000 | ORAL_TABLET | ORAL | Status: DC | PRN
  Filled 2014-12-19: qty 1

## 2014-12-19 MED ORDER — OXYCODONE-ACETAMINOPHEN 5-325 MG PO TABS: 2.0000 | ORAL_TABLET | ORAL | Status: DC | PRN

## 2014-12-19 MED ORDER — PHENYLEPHRINE 40 MCG/ML (10ML) SYRINGE FOR IV PUSH (FOR BLOOD PRESSURE SUPPORT)
80.0000 ug | PREFILLED_SYRINGE | INTRAVENOUS | Status: DC | PRN
  Filled 2014-12-19: qty 20

## 2014-12-19 MED ORDER — SIMETHICONE 80 MG PO CHEW: 80.0000 mg | CHEWABLE_TABLET | ORAL | Status: DC | PRN

## 2014-12-19 MED ORDER — OXYCODONE-ACETAMINOPHEN 5-325 MG PO TABS
2.0000 | ORAL_TABLET | ORAL | Status: DC | PRN
Start: 2014-12-19 — End: 2014-12-21

## 2014-12-19 MED ORDER — ONDANSETRON HCL 4 MG/2ML IJ SOLN: 4.0000 mg | INTRAMUSCULAR | Status: DC | PRN

## 2014-12-19 MED ORDER — CITRIC ACID-SODIUM CITRATE 334-500 MG/5ML PO SOLN: 30.0000 mL | ORAL | Status: DC | PRN

## 2014-12-19 MED ORDER — ZOLPIDEM TARTRATE 5 MG PO TABS: 5.0000 mg | ORAL_TABLET | Freq: Every evening | ORAL | Status: DC | PRN

## 2014-12-19 MED ORDER — PRENATAL MULTIVITAMIN CH
1.0000 | ORAL_TABLET | Freq: Every day | ORAL | Status: DC
  Administered 2014-12-20 – 2014-12-21 (×2): 1 via ORAL
  Filled 2014-12-19 (×2): qty 1

## 2014-12-19 MED ORDER — FENTANYL 2.5 MCG/ML BUPIVACAINE 1/10 % EPIDURAL INFUSION (WH - ANES)
14.0000 mL/h | INTRAMUSCULAR | Status: DC | PRN
  Filled 2014-12-19: qty 125

## 2014-12-19 MED ORDER — ONDANSETRON HCL 4 MG PO TABS: 4.0000 mg | ORAL_TABLET | ORAL | Status: DC | PRN

## 2014-12-19 MED ORDER — HYDROMORPHONE HCL 1 MG/ML IJ SOLN
1.0000 mg | Freq: Once | INTRAMUSCULAR | Status: AC
  Administered 2014-12-19: 1 mg via INTRAVENOUS

## 2014-12-19 MED ORDER — FENTANYL CITRATE (PF) 100 MCG/2ML IJ SOLN
100.0000 ug | INTRAMUSCULAR | Status: DC | PRN
  Administered 2014-12-19 (×2): 100 ug via INTRAVENOUS
  Filled 2014-12-19 (×2): qty 2

## 2014-12-19 MED ORDER — LANOLIN HYDROUS EX OINT: TOPICAL_OINTMENT | CUTANEOUS | Status: DC | PRN

## 2014-12-19 MED ORDER — BENZOCAINE-MENTHOL 20-0.5 % EX AERO
1.0000 "application " | INHALATION_SPRAY | CUTANEOUS | Status: DC | PRN
  Administered 2014-12-19: 1 via TOPICAL
  Filled 2014-12-19 (×2): qty 56

## 2014-12-19 MED ORDER — OXYTOCIN 40 UNITS IN LACTATED RINGERS INFUSION - SIMPLE MED
62.5000 mL/h | INTRAVENOUS | Status: DC
  Administered 2014-12-19: 62.5 mL/h via INTRAVENOUS
  Filled 2014-12-19 (×2): qty 1000

## 2014-12-19 MED ORDER — NALBUPHINE HCL 10 MG/ML IJ SOLN: 10.0000 mg | INTRAMUSCULAR | Status: DC | PRN

## 2014-12-19 MED ORDER — OXYTOCIN BOLUS FROM INFUSION
500.0000 mL | INTRAVENOUS | Status: DC
  Administered 2014-12-19: 500 mL via INTRAVENOUS

## 2014-12-19 MED ORDER — WITCH HAZEL-GLYCERIN EX PADS: 1.0000 "application " | MEDICATED_PAD | CUTANEOUS | Status: DC | PRN

## 2014-12-19 MED ORDER — LACTATED RINGERS IV SOLN
INTRAVENOUS | Status: DC
  Administered 2014-12-19: 07:00:00 via INTRAVENOUS

## 2014-12-19 MED ORDER — TETANUS-DIPHTH-ACELL PERTUSSIS 5-2.5-18.5 LF-MCG/0.5 IM SUSP: 0.5000 mL | Freq: Once | INTRAMUSCULAR | Status: DC

## 2014-12-19 MED ORDER — OXYCODONE-ACETAMINOPHEN 5-325 MG PO TABS: 1.0000 | ORAL_TABLET | ORAL | Status: DC | PRN

## 2014-12-19 MED ORDER — SENNOSIDES-DOCUSATE SODIUM 8.6-50 MG PO TABS
2.0000 | ORAL_TABLET | ORAL | Status: DC
  Administered 2014-12-19 – 2014-12-20 (×2): 2 via ORAL
  Filled 2014-12-19 (×2): qty 2

## 2014-12-19 MED ORDER — HYDROMORPHONE HCL 1 MG/ML IJ SOLN
INTRAMUSCULAR | Status: AC
  Administered 2014-12-19: 1 mg via INTRAVENOUS
  Filled 2014-12-19: qty 1

## 2014-12-19 MED ORDER — LIDOCAINE HCL (PF) 1 % IJ SOLN
30.0000 mL | INTRAMUSCULAR | Status: DC | PRN
  Filled 2014-12-19: qty 30

## 2014-12-19 MED ORDER — DIPHENHYDRAMINE HCL 50 MG/ML IJ SOLN: 12.5000 mg | INTRAMUSCULAR | Status: DC | PRN

## 2014-12-19 MED ORDER — DIPHENHYDRAMINE HCL 25 MG PO CAPS: 25.0000 mg | ORAL_CAPSULE | Freq: Four times a day (QID) | ORAL | Status: DC | PRN

## 2014-12-19 MED ORDER — ONDANSETRON HCL 4 MG/2ML IJ SOLN: 4.0000 mg | Freq: Four times a day (QID) | INTRAMUSCULAR | Status: DC | PRN

## 2014-12-19 MED ORDER — EPHEDRINE 5 MG/ML INJ: 10.0000 mg | INTRAVENOUS | Status: DC | PRN

## 2014-12-19 MED ORDER — DIBUCAINE 1 % RE OINT
1.0000 "application " | TOPICAL_OINTMENT | RECTAL | Status: DC | PRN
  Filled 2014-12-19: qty 28

## 2014-12-19 MED ORDER — IBUPROFEN 600 MG PO TABS
600.0000 mg | ORAL_TABLET | Freq: Four times a day (QID) | ORAL | Status: DC
  Administered 2014-12-19 – 2014-12-21 (×7): 600 mg via ORAL
  Filled 2014-12-19 (×9): qty 1

## 2014-12-19 NOTE — H&P (Signed)
Hannah Knapp is a 34 y.o. female, G2P1001 at 75 2/7 weeks presents via EMS with complaints of regular contractions and SROM (clear fluid) since 05:45. +FM. Denies bleeding, CP, SOB, difficulty breathing, nausea, vomiting, fever or chills.  Patient Active Problem List   Diagnosis Date Noted  . Normal labor 12/19/2014  . Positive GBS test 12/19/2014  . Normal vaginal delivery 12/19/2014  . H/O myomectomy - 2011 - Vaginal Delivery advised - has had vaginal delivery since myomectomy. 12/19/2014  . Thyroid nodule 12/19/2014  . Allergy to shellfish 12/19/2014  . Allergy history, drug - Azithromycin 12/19/2014  . Environmental allergies 12/19/2014  . Pet allergy 12/19/2014  . Fibroid uterus 09/03/2011    History of present pregnancy: Patient entered care at 12.2 wks.   [facility] of 17 Dec 2014 was established by sure LMP.  Anatomy scan: 19 2/7 weeks, with normal findings and a posterior (left lateral) placenta.   Additional [place] evaluations: 36 4/7 wks with vertex presentation, EFW 3096gm, normal AFI. Significant prenatal events: Common discomforts of pregnancy - reassurances given. Treated for acute sinusitis at [redacted] wks gestation with Amoxicillin.                                              Hx of myomectomy prior to first pregnancy. Cleared for vaginal delivery by Dr Raphael Gibney on 07/25/2014.                                              Endocrinology referral for evaluation of pre-pregnancy thyroid nodule. Followed by Dr. Chalmers Cater. Last TSH (06/06/2014) WNL.  GBS Positive.  TWG 24.5 lbs. Last evaluation:  Office on 26 May 16 @ 39.5 wks by Dr. Raphael Gibney. Cvx 2/30/-3. BP 100/60.   OB History    Gravida Para Term Preterm AB TAB SAB Ectopic Multiple Living   2 2 2       0 2    SVD on 09/03/2011 @ 40 wks, female infant, birthwt 8+1, epidural, [facility] "Louis IV"  Past Medical History  Diagnosis Date  . Fibroid   . Hypothyroidism   . Thyroid nodule    Past Surgical History  Procedure Laterality Date  .  Hysteroscopy    . Wisdom tooth extraction    . Myomectomy     Family History: family history includes Depression in her maternal grandmother; Fibromyalgia in her maternal grandmother; Heart disease in her maternal grandfather; Hypertension in her maternal grandmother; Migraines in her maternal grandmother.Hyperglycemia in her mother. Social History:  reports that she has never smoked. She has never used smokeless tobacco. She reports that she does not drink alcohol or use illicit drugs.Pt is an African-American with a masters degree and employed as an Nurse, mental health. She is married to [place] who is present and supportive. She is of the [place] and will accept blood in an emergency.   Prenatal Transfer Tool  Maternal Diabetes: No Genetic Screening: Negative Maternal Ultrasounds/Referrals: Endocrinology referral due to thyroid nodule Fetal Ultrasounds or other Referrals:  None Maternal Substance Abuse:  Denies tobacco, ETOH and illicit drug consumption Significant Maternal Medications:  Prenatal vitamins; EpiPen; Tylenol Significant Maternal Lab Results: GBS positive  TDAP: Declined due to last one in 2011 Flu: 09/02/14  ROS: Per  HPI  Allergies  Allergen Reactions  . Azithromycin Other (See Comments)    Makes patient very sick  . Other Hives and Itching    Shellfish and allergic to molds and pet dander    Blood pressure 113/79, pulse 81, temperature 98.1 F (36.7 C), temperature source Oral, resp. rate 18, height 5\' 3"  (1.6 m), weight 75.751 kg (167 lb), SpO2 100 %, unknown if currently breastfeeding.  Gen: NAD Chest: Clear Heart: RRR without murmur Abd: gravid, NT, FH CWD Pelvic: Proven to 8+1. Cvx 4-5/90%/0/Cephalic EFW: 7+61 Ext: 2+ edema in BL lower legs and feet  FHR: Category 1 UCs:  q2-4 minutes  Prenatal labs: ABO, Rh: A+ (05/02/2014) Antibody: Negative (05/02/2014) Rubella:  Immune (05/02/2014) RPR: NR (05/02/2014)            NR  (11/15/2014) HBsAg: Negative (05/02/2014) HIV: NR (05/02/2014) GBS: Positive (11/15/2014) Sickle cell/Hgb electrophoresis:  Normal study (05/02/2014) Pap: WNL (04/01/2014) GC: Negative (05/02/2014) Chlamydia: Negative (05/02/2014) Genetic screenings: AFP Quad- Negative (07/25/2014) Glucola: Normal at 78 (10/06/2014) Other: E. Coli on 05/02/14, neg TOC on 06/06/14. Neg fFN at 27.3 wks  Hgb 11.8 at NOB, 11.2 at 28 weeks   Assessment: IUP at 40 2/7 wks Early labor GBS positive Shellfish allergy Thyroid nodule H/O myomectomy - cleared for vaginal delivery - last delivery vaginal w/o complications Cat 1 FHRT  Plan: Admit to [place] per consult with Dr. Nelda Marseille Routine [facility] orders Pain med/epidural prn Ampicillin for GBS prophylaxis. Expect progress and SVD.   Laurey Morale, [place] 12/19/2014, 06:15 AM

## 2014-12-19 NOTE — Progress Notes (Signed)
S: Pt now in BS. Called to room due to prolonged late decels. Staff unable to place IV in MAU x 2 attempts - in progress now. Pt w/ reports of increased pain. O: VSS, afebrile. FHRT: BL 140 w/ mod variability and non repetitive late decels to 90-100 w/ gradual return to baseline w/ intrauterine resuscitative measures. Ctxs: q 1-2 min, palpate moderate to severe. Cvx: 5/90/0. A: IUP at term. Cat 2 FHRT. P: FSE placed. Continue intrauterine resuscitative measures. Fentanyl IV. Monitor closely. Anticipate progress and SVD. Dr. Nelda Marseille updated.   Farrel Gordon, CNM 12/19/14, 06:45 AM

## 2014-12-19 NOTE — Anesthesia Postprocedure Evaluation (Signed)
Pt went to complete. No procedure done.

## 2014-12-19 NOTE — Plan of Care (Signed)
Problem: Consults Goal: Skin Care Protocol Initiated - if Braden Score 18 or less If consults are not indicated, leave blank or document N/A  Outcome: Not Applicable Date Met:  25/95/63 Braden scale 19.

## 2014-12-19 NOTE — Anesthesia Preprocedure Evaluation (Signed)
Anesthesia Evaluation  Patient identified by MRN, date of birth, ID band Patient awake    Reviewed: Allergy & Precautions, H&P , NPO status , Patient's Chart, lab work & pertinent test results  Airway Mallampati: I  TM Distance: >3 FB Neck ROM: full    Dental no notable dental hx.    Pulmonary neg pulmonary ROS,    Pulmonary exam normal       Cardiovascular negative cardio ROS Normal cardiovascular exam    Neuro/Psych negative neurological ROS  negative psych ROS   GI/Hepatic negative GI ROS, Neg liver ROS,   Endo/Other    Renal/GU negative Renal ROS     Musculoskeletal   Abdominal Normal abdominal exam  (+)   Peds  Hematology negative hematology ROS (+)   Anesthesia Other Findings   Reproductive/Obstetrics (+) Pregnancy                             Anesthesia Physical Anesthesia Plan  ASA: II  Anesthesia Plan: Epidural   Post-op Pain Management:    Induction:   Airway Management Planned:   Additional Equipment:   Intra-op Plan:   Post-operative Plan:   Informed Consent: I have reviewed the patients History and Physical, chart, labs and discussed the procedure including the risks, benefits and alternatives for the proposed anesthesia with the patient or authorized representative who has indicated his/her understanding and acceptance.     Plan Discussed with:   Anesthesia Plan Comments:         Anesthesia Quick Evaluation

## 2014-12-20 LAB — CBC
HCT: 30.1 % — ABNORMAL LOW (ref 36.0–46.0)
HEMOGLOBIN: 10.3 g/dL — AB (ref 12.0–15.0)
MCH: 31 pg (ref 26.0–34.0)
MCHC: 34.6 g/dL (ref 30.0–36.0)
MCV: 89.6 fL (ref 78.0–100.0)
Platelets: 151 10*3/uL (ref 150–400)
RBC: 3.36 MIL/uL — ABNORMAL LOW (ref 3.87–5.11)
RDW: 13.7 % (ref 11.5–15.5)
WBC: 12.2 10*3/uL — AB (ref 4.0–10.5)

## 2014-12-20 NOTE — Progress Notes (Signed)
Hannah Knapp   Subjective: Post Partum Day 1 Vaginal delivery, No laceration Patient up ad lib, denies syncope or dizziness. Reports consuming regular diet without issues and denies N/V No issues with urination and reports bleeding is appropriate  Feeding:  breast Contraceptive plan:   unsure  Objective: Temp:  [98.1 F (36.7 C)-98.8 F (37.1 C)] 98.3 F (36.8 C) (05/31 0622) Pulse Rate:  [70-85] 81 (05/31 0622) Resp:  [18-20] 18 (05/31 0622) BP: (106-139)/(63-90) 120/70 mmHg (05/31 0622) SpO2:  [98 %-100 %] 98 % (05/31 0622)  Physical Exam:  General: alert and cooperative Ext: WNL, no edema. No evidence of DVT seen on physical exam. Breast: Soft filling Lungs: CTAB Heart RRR without murmur  Abdomen:  Soft, fundus firm, lochia scant, + bowel sounds, non distended, non tender Lochia: appropriate Uterine Fundus: firm Laceration: n/a    Recent Labs  12/19/14 0650 12/20/14 0534  HGB 13.5 10.3*  HCT 38.2 30.1*    Assessment S/P Vaginal Delivery-Day 1 Stable  Normal Involution Breastfeeding Circumcision: in patient   Plan: Continue current care Plan for discharge tomorrow, Breastfeeding and Lactation consult Lactation support   Coby Shrewsberry, CNM, MSN 12/20/2014, 10:36 AM

## 2014-12-20 NOTE — Lactation Note (Addendum)
This note was copied from the chart of Hannah Knapp. Lactation Consultation Note  P2.  BF first son for 6 weeks and stated she never got her milk supply to come in fully. Reviewed supply and demand.  Mom encouraged to feed baby 8-12 times/24 hours and with feeding cues.  Encouraged mother to breastfeed often to establish her milk supply. Mother went for approx 7 hours last night without breastfeeding.  Reviewed how often baby should be breastfed. Baby recently returned back from circ.  Suggest in approx 1.5 hrs 1430 to check diaper and attempt breastfeeding baby. If he does not latch, suggest STS and retry breastfeeding after.  Discussed cluster feeding, breast compression and waiting until baby opens wide to latch. Reviewed hand expression with teach back.  Mother pleased she was able to hand express good flow of colostrum from both breasts. Mother denies problems with latching.  Suggest she call if she would like assistance. Mom made aware of O/P services, breastfeeding support groups, community resources, and our phone # for post-discharge questions.    Patient Name: Hannah Calvin Marron Today's Date: 12/20/2014 Reason for consult: Initial assessment   Maternal Data Has patient been taught Hand Expression?: Yes Does the patient have breastfeeding experience prior to this delivery?: Yes  Feeding Feeding Type: Breast Fed Length of feed: 15 min  LATCH Score/Interventions                      Lactation Tools Discussed/Used     Consult Status Consult Status: Follow-up Date: 12/21/14 Follow-up type: In-patient    Vivianne Master [facility] 12/20/2014, 1:32 PM

## 2014-12-21 MED ORDER — IBUPROFEN 600 MG PO TABS: 600.0000 mg | ORAL_TABLET | Freq: Four times a day (QID) | ORAL | Status: DC

## 2014-12-21 NOTE — Discharge Summary (Signed)
  Vaginal Delivery Discharge Summary  Hannah Knapp  DOB:    01-18-1981 MRN:    [id number] CSN:    [id number]   Date of admission:                  12/19/14  Date of discharge:                   12/21/14  Procedures this admission:   SVB  Date of Delivery: 12/19/14  Newborn Data:  Live born female  Birth Weight: 7 lb 9.7 oz (3450 g) APGAR: 8, 9  Home with mother. Name: Hannah Knapp Circumcision Plan: Inpatient  History of Present Illness:  [name] is a 34 y.o. female, [id number], who presents at [redacted]w[redacted]d weeks gestation. The patient has been followed at [facility]. She was admitted for onset of labor and rupture of membranes. Her pregnancy has been complicated by:  Patient Active Problem List   Diagnosis Date Noted  . Positive GBS test 12/19/2014  . Normal vaginal delivery 12/19/2014  . H/O myomectomy - 2011 - Vaginal Delivery advised - has had vaginal delivery since myomectomy. 12/19/2014  . Thyroid nodule 12/19/2014  . Allergy to shellfish 12/19/2014  . Allergy history, drug - Azithromycin 12/19/2014  . Environmental allergies 12/19/2014  . Pet allergy 12/19/2014  . Fibroid uterus 09/03/2011     Hospital Course:   Admitting Dx: IUP at 2 2/7 weeks, SROM, early labor GBS Status:  Positive Delivering Clinician: Venus Standard Lacerations/MLE: Bilateral labial, no repair required Complications: None Anesthesia:  IV med, epidural ordered but labor progressed too rapidly Comments:   None  Intrapartum Procedures: spontaneous vaginal delivery Postpartum Procedures: none Complications-Operative and Postpartum: none  Discharge Diagnoses: Term Pregnancy-delivered and GBS positive  Feeding:  breast  Contraception:  condoms  Hemoglobin Results:  CBC Latest Ref Rng 12/20/2014 12/19/2014 09/04/2011  WBC 4.0 - 10.5 K/uL 12.2(H) 11.8(H) 13.2(H)  Hemoglobin 12.0 - 15.0 g/dL 10.3(L) 13.5 10.5(L)   Hematocrit 36.0 - 46.0 % 30.1(L) 38.2 30.1(L)  Platelets 150 - 400 K/uL 151 188 134(L)    Discharge Physical Exam:   General: alert Lochia: appropriate Uterine Fundus: firm Incision: Healing well DVT Evaluation: No evidence of DVT seen on physical exam. Negative Homan's sign.   Discharge Information:  Activity:           pelvic rest Diet:                routine Medications: Ibuprofen Condition:      stable Instructions:  Routine pp instructions   Discharge to: home  Follow-up Information    Follow up with [facility]. Schedule an appointment as soon as possible for a visit in 6 weeks.   Specialty:  Obstetrics and Gynecology   Why:  Call for any questions or concerns.   Contact information:   [address] [place] [address] [phone]       Donnel Saxon CNM 12/21/2014 8:16 AM

## 2014-12-21 NOTE — Discharge Instructions (Signed)

## 2014-12-21 NOTE — Lactation Note (Signed)
This note was copied from the chart of Hannah Knapp. Lactation Consultation Note  Follow up visit made prior to discharge.  Baby has been cluster feeding but not always content after feeds so mom is supplementing with a small amount of formula.  Mom states she breastfed her first baby for 6 weeks and needed to supplement also due to low milk supply.  Recommended mom post pump every 3-4 hours when she gets home.  Instructed to give any expressed milk back to baby and small amount of formula if needed.  Discussed and encouraged an outpatient appointment once milk is in.  Patient Name: Hannah Micaella Cheramie Today's Date: 12/21/2014     Maternal Data    Feeding Feeding Type: Breast Fed Length of feed: 20 min  LATCH Score/Interventions                      Lactation Tools Discussed/Used     Consult Status      Ave Filter 12/21/2014, 10:37 AM

## 2015-01-06 ENCOUNTER — Ambulatory Visit
Admission: RE | Admit: 2015-01-06 | Discharge: 2015-01-06 | Disposition: A | Discharge status: Home / Self Care | Payer: BC Managed Care – PPO | Source: Ambulatory Visit | Attending: Endocrinology | Admitting: Endocrinology

## 2015-01-06 DIAGNOSIS — E041 Nontoxic single thyroid nodule: Secondary | ICD-10-CM

## 2015-10-02 ENCOUNTER — Other Ambulatory Visit: Payer: Self-pay | Admitting: Endocrinology

## 2015-10-02 DIAGNOSIS — E041 Nontoxic single thyroid nodule: Secondary | ICD-10-CM

## 2015-10-11 ENCOUNTER — Other Ambulatory Visit: Payer: BC Managed Care – PPO

## 2015-10-18 ENCOUNTER — Ambulatory Visit
Admission: RE | Admit: 2015-10-18 | Discharge: 2015-10-18 | Disposition: A | Discharge status: Home / Self Care | Payer: BC Managed Care – PPO | Source: Ambulatory Visit | Attending: Endocrinology | Admitting: Endocrinology

## 2015-10-18 DIAGNOSIS — E041 Nontoxic single thyroid nodule: Secondary | ICD-10-CM

## 2016-02-19 ENCOUNTER — Encounter (HOSPITAL_COMMUNITY): Payer: Self-pay

## 2016-02-19 ENCOUNTER — Emergency Department (HOSPITAL_COMMUNITY)
Admission: EM | Admit: 2016-02-19 | Discharge: 2016-02-19 | Disposition: A | Discharge status: Home / Self Care | Payer: BC Managed Care – PPO | Attending: Emergency Medicine | Admitting: Emergency Medicine

## 2016-02-19 ENCOUNTER — Emergency Department (HOSPITAL_COMMUNITY): Payer: BC Managed Care – PPO

## 2016-02-19 DIAGNOSIS — S161XXA Strain of muscle, fascia and tendon at neck level, initial encounter: Secondary | ICD-10-CM | POA: Diagnosis not present

## 2016-02-19 DIAGNOSIS — S29012A Strain of muscle and tendon of back wall of thorax, initial encounter: Secondary | ICD-10-CM | POA: Insufficient documentation

## 2016-02-19 DIAGNOSIS — Y939 Activity, unspecified: Secondary | ICD-10-CM | POA: Insufficient documentation

## 2016-02-19 DIAGNOSIS — Y999 Unspecified external cause status: Secondary | ICD-10-CM | POA: Diagnosis not present

## 2016-02-19 DIAGNOSIS — E039 Hypothyroidism, unspecified: Secondary | ICD-10-CM | POA: Insufficient documentation

## 2016-02-19 DIAGNOSIS — S299XXA Unspecified injury of thorax, initial encounter: Secondary | ICD-10-CM | POA: Insufficient documentation

## 2016-02-19 DIAGNOSIS — R52 Pain, unspecified: Secondary | ICD-10-CM

## 2016-02-19 DIAGNOSIS — S298XXA Other specified injuries of thorax, initial encounter: Secondary | ICD-10-CM

## 2016-02-19 DIAGNOSIS — S233XXA Sprain of ligaments of thoracic spine, initial encounter: Secondary | ICD-10-CM

## 2016-02-19 DIAGNOSIS — Y92410 Unspecified street and highway as the place of occurrence of the external cause: Secondary | ICD-10-CM | POA: Diagnosis not present

## 2016-02-19 DIAGNOSIS — S199XXA Unspecified injury of neck, initial encounter: Secondary | ICD-10-CM | POA: Diagnosis present

## 2016-02-19 MED ORDER — IBUPROFEN 600 MG PO TABS: 600.0000 mg | ORAL_TABLET | Freq: Three times a day (TID) | ORAL | 0 refills | Status: AC | PRN

## 2016-02-19 MED ORDER — IBUPROFEN 400 MG PO TABS
600.0000 mg | ORAL_TABLET | Freq: Once | ORAL | Status: AC
  Administered 2016-02-19: 600 mg via ORAL
  Filled 2016-02-19: qty 1

## 2016-02-19 NOTE — Discharge Instructions (Signed)
You have neck pain, possibly from a cervical strain and/or pinched nerve.  ° °SEEK IMMEDIATE MEDICAL ATTENTION IF: °You develop difficulties swallowing or breathing.  °You have new or worse numbness, weakness, tingling, or movement problems in your arms or legs.  °You develop increasing pain which is uncontrolled with medications.  °You have change in bowel or bladder function, or other concerns. ° ° ° °
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
## 2016-11-07 ENCOUNTER — Other Ambulatory Visit: Payer: Self-pay | Admitting: Nurse Practitioner

## 2016-11-07 DIAGNOSIS — E049 Nontoxic goiter, unspecified: Secondary | ICD-10-CM

## 2018-05-27 ENCOUNTER — Encounter: Payer: Self-pay | Admitting: Internal Medicine

## 2018-05-27 ENCOUNTER — Ambulatory Visit: Payer: BC Managed Care – PPO | Admitting: Internal Medicine

## 2018-05-27 VITALS — BP 100/70 | HR 95 | Temp 98.4°F | Ht 63.0 in | Wt 170.8 lb

## 2018-05-27 DIAGNOSIS — Z9109 Other allergy status, other than to drugs and biological substances: Secondary | ICD-10-CM | POA: Diagnosis not present

## 2018-05-27 DIAGNOSIS — J0101 Acute recurrent maxillary sinusitis: Secondary | ICD-10-CM | POA: Diagnosis not present

## 2018-05-27 MED ORDER — TRIAMCINOLONE ACETONIDE 40 MG/ML IJ SUSP
40.0000 mg | Freq: Once | INTRAMUSCULAR | Status: AC
  Administered 2018-05-27: 40 mg via INTRAMUSCULAR

## 2018-05-27 MED ORDER — AMOXICILLIN-POT CLAVULANATE 875-125 MG PO TABS: 1.0000 | ORAL_TABLET | Freq: Two times a day (BID) | ORAL | 0 refills | Status: AC

## 2018-05-27 NOTE — Patient Instructions (Signed)
  Use Flonase 2 sprays each nostril ones a day for 7 days for sinus pressure and stuffy nose.       Sinusitis, Adult Sinusitis is soreness and inflammation of your sinuses. Sinuses are hollow spaces in the bones around your face. They are located:  Around your eyes.  In the middle of your forehead.  Behind your nose.  In your cheekbones.  Your sinuses and nasal passages are lined with a stringy fluid (mucus). Mucus normally drains out of your sinuses. When your nasal tissues get inflamed or swollen, the mucus can get trapped or blocked so air cannot flow through your sinuses. This lets bacteria, viruses, and funguses grow, and that leads to infection. Follow these instructions at home: Medicines  Take, use, or apply over-the-counter and prescription medicines only as told by your doctor. These may include nasal sprays.  If you were prescribed an antibiotic medicine, take it as told by your doctor. Do not stop taking the antibiotic even if you start to feel better. Hydrate and Humidify  Drink enough water to keep your pee (urine) clear or pale yellow.  Use a cool mist humidifier to keep the humidity level in your home above 50%.  Breathe in steam for 10-15 minutes, 3-4 times a day or as told by your doctor. You can do this in the bathroom while a hot shower is running.  Try not to spend time in cool or dry air. Rest  Rest as much as possible.  Sleep with your head raised (elevated).  Make sure to get enough sleep each night. General instructions  Put a warm, moist washcloth on your face 3-4 times a day or as told by your doctor. This will help with discomfort.  Wash your hands often with soap and water. If there is no soap and water, use hand sanitizer.  Do not smoke. Avoid being around people who are smoking (secondhand smoke).  Keep all follow-up visits as told by your doctor. This is important. Contact a doctor if:  You have a fever.  Your symptoms get  worse.  Your symptoms do not get better within 10 days. Get help right away if:  You have a very bad headache.  You cannot stop throwing up (vomiting).  You have pain or swelling around your face or eyes.  You have trouble seeing.  You feel confused.  Your neck is stiff.  You have trouble breathing. This information is not intended to replace advice given to you by your health care provider. Make sure you discuss any questions you have with your health care provider. Document Released: 12/25/2007 Document Revised: 03/03/2016 Document Reviewed: 05/03/2015 Elsevier Interactive Patient Education  [employer].

## 2018-05-27 NOTE — Progress Notes (Signed)
Subjective:     Patient ID: Hannah Knapp , female    DOB: Jul 23, 1980 , 37 y.o.   MRN: [id number]   Chief Complaint  Patient presents with  . Nasal Congestion    ear congestion, facial pressure, 1 month , post nasal drip     HPI For the past month she has been dealing with R face pain and sinus congestion. For the past 5 days has been having  Sinus pressure and R>L Has been feeling ear  pressure and popping in L ear.  Has been having lime color nose mucous with orange tinge mucous with a bad taste. May have had subjective fever 5 days ago.   She has Fall allergies to environment and get hit like this every year. Takes generic zyrtec qd.  Past Medical History:  Diagnosis Date  . Fibroid   . Hypothyroidism   . Thyroid nodule      Family History  Problem Relation Age of Onset  . Hypertension Maternal Grandmother   . Migraines Maternal Grandmother   . Fibromyalgia Maternal Grandmother   . Depression Maternal Grandmother   . Heart disease Maternal Grandfather      Current Outpatient Medications:  .  EPINEPHrine (EPIPEN 2-PAK) 0.3 mg/0.3 mL IJ SOAJ injection, EpiPen 2-Pak 0.3 mg/0.3 mL injection, auto-injector, Disp: , Rfl:  .  ibuprofen (ADVIL,MOTRIN) 600 MG tablet, Take 1 tablet (600 mg total) by mouth every 8 (eight) hours as needed for moderate pain., Disp: 30 tablet, Rfl: 0 .  levocetirizine (XYZAL) 5 MG tablet, TAKE 1 TABLET BY ORAL ROUTE EVERY DAY IN THE EVENING, Disp: , Rfl: 5   Allergies  Allergen Reactions  . Azithromycin Other (See Comments)    Makes patient very sick  . Other Hives and Itching    Shellfish and allergic to molds and pet dander     Review of Systems  Constitutional: Positive for chills and fatigue. Negative for fever.  HENT: Positive for congestion, sinus pressure, sinus pain and sneezing. Negative for dental problem, ear discharge, ear pain, facial swelling, hearing loss, postnasal drip, rhinorrhea, sore throat and trouble swallowing.   Eyes:  Positive for discharge and itching.       WATERY EYES  Respiratory: Positive for cough. Negative for chest tightness, shortness of breath and wheezing.   Cardiovascular: Negative for chest pain, palpitations and leg swelling.  Skin: Negative for rash.  Allergic/Immunologic: Positive for environmental allergies.  Neurological: Positive for headaches.  Hematological: Negative for adenopathy.   Has been doing saline nose rinses, but is not helping. Tried Afrin 3 days ago and helped a little.   Today's Vitals   05/27/18 1201  BP: 100/70  Pulse: 95  Temp: 98.4 F (36.9 C)  TempSrc: Oral  SpO2: 97%  Weight: 170 lb 12.8 oz (77.5 kg)  Height: 5\' 3"  (1.6 m)   Body mass index is 30.26 kg/m.   Objective:  Physical Exam  Constitutional: She is oriented to person, place, and time. She appears well-developed and well-nourished. No distress.  HENT:  Head: Normocephalic.  Right Ear: External ear normal.  Left Ear: External ear normal.  Mouth/Throat: Oropharynx is clear and moist. No oropharyngeal exudate.  Has severe swelling of R nose mucosa and mild of L. All her sinuses are tender, but worse on L maxillary and frontal.   Eyes: Conjunctivae are normal. Right eye exhibits no discharge. Left eye exhibits no discharge. No scleral icterus.  Neck: Neck supple.  Cardiovascular: Normal rate and regular rhythm.  No murmur heard. Pulmonary/Chest: Effort normal and breath sounds normal.  Lymphadenopathy:    She has no cervical adenopathy.  Neurological: She is alert and oriented to person, place, and time.  Skin: Skin is warm and dry. She is not diaphoretic.  Psychiatric: She has a normal mood and affect. Her behavior is normal. Judgment and thought content normal.  Nursing note and vitals reviewed.  Assessment And Plan:  1. Environmental allergies- chronic - triamcinolone acetonide (KENALOG-40) injection 40 mg 2-  Acute sinusitis- placed on Augmentin. She did not want prednisone pills and  requested a shot instead so she was given Kenalog 40 mg IM. I discussed with her about trial of Singular which apparently was offered by Dr [facility] in the past, but pt does not want to end up with Has. She will also work on changing her allergy meds yearly.  FU prn   Danasha Melman RODRIGUEZ-SOUTHWORTH, PA-C

## 2018-08-06 ENCOUNTER — Encounter: Payer: Self-pay | Admitting: Nurse Practitioner

## 2018-08-06 ENCOUNTER — Ambulatory Visit (INDEPENDENT_AMBULATORY_CARE_PROVIDER_SITE_OTHER): Payer: BC Managed Care – PPO | Admitting: Nurse Practitioner

## 2018-08-06 VITALS — BP 104/70 | HR 87 | Temp 98.6°F | Ht 63.2 in | Wt 170.4 lb

## 2018-08-06 DIAGNOSIS — Z3202 Encounter for pregnancy test, result negative: Secondary | ICD-10-CM

## 2018-08-06 DIAGNOSIS — R1031 Right lower quadrant pain: Secondary | ICD-10-CM | POA: Diagnosis not present

## 2018-08-06 DIAGNOSIS — R300 Dysuria: Secondary | ICD-10-CM | POA: Diagnosis not present

## 2018-08-06 LAB — POCT URINALYSIS DIPSTICK
Bilirubin, UA: NEGATIVE
Blood, UA: NEGATIVE
Glucose, UA: NEGATIVE
Ketones, UA: NEGATIVE
LEUKOCYTES UA: NEGATIVE
Nitrite, UA: NEGATIVE
PROTEIN UA: NEGATIVE
Spec Grav, UA: 1.02 (ref 1.010–1.025)
Urobilinogen, UA: 0.2 E.U./dL
pH, UA: 7.5 (ref 5.0–8.0)

## 2018-08-06 LAB — POCT URINE PREGNANCY: PREG TEST UR: NEGATIVE

## 2018-08-06 NOTE — Patient Instructions (Signed)
   If discomfort returns in next week call to office or Dr Raphael Gibney for evaluation or ultrasound

## 2018-08-06 NOTE — Progress Notes (Signed)
Subjective:     Patient ID: Hannah Knapp , female    DOB: 1981-06-27 , 38 y.o.   MRN: [id number]   Chief Complaint  Patient presents with  . Dysuria    patient states on tuesday when she got up in the morning she had some right flank pain that turned into vaginal cramping and it went on for several hours. on saturday pt stated she did not feel well she had chills but no fever and some abdominal pain and fatigued.    HPI  Dr. Raphael Gibney is her GYN, attempted to call unable to speak with him.  Right lower abdomen pain.  Patient's last menstrual period was 07/22/2018.   Dysuria   This is a new problem. The current episode started in the past 7 days. The problem occurs intermittently. The problem has been waxing and waning. Quality: cramping to abdomen and vagina. There has been no fever (chills). There is no history of pyelonephritis. Associated symptoms include chills and frequency (Tuesday night had frequency about 5 times). Pertinent negatives include no flank pain, hematuria or nausea. She has tried NSAIDs for the symptoms. The treatment provided significant relief. There is no history of recurrent UTIs.     Past Medical History:  Diagnosis Date  . Fibroid   . Hypothyroidism   . Thyroid nodule      Family History  Problem Relation Age of Onset  . Hypertension Maternal Grandmother   . Migraines Maternal Grandmother   . Fibromyalgia Maternal Grandmother   . Depression Maternal Grandmother   . Heart disease Maternal Grandfather      Current Outpatient Medications:  .  EPINEPHrine (EPIPEN 2-PAK) 0.3 mg/0.3 mL IJ SOAJ injection, EpiPen 2-Pak 0.3 mg/0.3 mL injection, auto-injector, Disp: , Rfl:  .  ibuprofen (ADVIL,MOTRIN) 600 MG tablet, Take 1 tablet (600 mg total) by mouth every 8 (eight) hours as needed for moderate pain., Disp: 30 tablet, Rfl: 0 .  levocetirizine (XYZAL) 5 MG tablet, TAKE 1 TABLET BY ORAL ROUTE EVERY DAY IN THE EVENING, Disp: , Rfl: 5   Allergies  Allergen  Reactions  . Azithromycin Other (See Comments)    Makes patient very sick  . Other Hives and Itching    Shellfish and allergic to molds and pet dander     Review of Systems  Constitutional: Positive for chills.  Respiratory: Negative.  Negative for cough.   Cardiovascular: Negative.  Negative for chest pain, palpitations and leg swelling.  Gastrointestinal: Positive for abdominal pain. Negative for constipation and nausea.  Genitourinary: Positive for dysuria, frequency (Tuesday night had frequency about 5 times) and vaginal pain (resolved). Negative for flank pain, hematuria and vaginal discharge.     Today's Vitals   08/06/18 0930  BP: 104/70  Pulse: 87  Temp: 98.6 F (37 C)  TempSrc: Oral  SpO2: 96%  Weight: 170 lb 6.4 oz (77.3 kg)  Height: 5' 3.2" (1.605 m)  PainSc: 0-No pain   Body mass index is 29.99 kg/m.   Objective:  Physical Exam Vitals signs reviewed.  Constitutional:      Appearance: Normal appearance.  Cardiovascular:     Rate and Rhythm: Normal rate and regular rhythm.     Pulses: Normal pulses.     Heart sounds: Normal heart sounds. No murmur.  Pulmonary:     Effort: Pulmonary effort is normal.     Breath sounds: Normal breath sounds.  Abdominal:     General: Abdomen is flat. Bowel sounds are normal. There is  no distension.     Palpations: Abdomen is soft.     Tenderness: There is no abdominal tenderness (mild lower right quadrant).  Skin:    General: Skin is warm and dry.  Neurological:     General: No focal deficit present.     Mental Status: She is alert and oriented to person, place, and time.  Psychiatric:        Mood and Affect: Mood normal.         Assessment And Plan:     1. Dysuria  Negative urinalysis  Negative pregnancy test - POCT Urinalysis Dipstick (81002)  2. Right lower quadrant abdominal pain  No longer having pain  Mild discomfort on palpation  Suspect this was an ovarian cyst which has resolved, she is advised  if symptoms reoccur in the next week to return call to office or call Dr Doran Stabler office for possible ultrasound or evaluation.     Minette Brine, FNP

## 2018-10-01 ENCOUNTER — Other Ambulatory Visit: Payer: Self-pay | Admitting: Endocrinology

## 2018-10-01 DIAGNOSIS — E041 Nontoxic single thyroid nodule: Secondary | ICD-10-CM

## 2018-10-05 ENCOUNTER — Other Ambulatory Visit: Payer: BC Managed Care – PPO

## 2018-10-25 ENCOUNTER — Other Ambulatory Visit: Payer: Self-pay | Admitting: Internal Medicine

## 2018-10-27 ENCOUNTER — Other Ambulatory Visit: Payer: BC Managed Care – PPO

## 2019-01-21 ENCOUNTER — Other Ambulatory Visit: Payer: Self-pay | Admitting: Internal Medicine

## 2019-04-19 ENCOUNTER — Other Ambulatory Visit: Payer: Self-pay | Admitting: Nurse Practitioner

## 2019-04-27 ENCOUNTER — Other Ambulatory Visit: Payer: Self-pay

## 2019-04-27 ENCOUNTER — Encounter: Payer: Self-pay | Admitting: Nurse Practitioner

## 2019-04-27 ENCOUNTER — Ambulatory Visit (INDEPENDENT_AMBULATORY_CARE_PROVIDER_SITE_OTHER): Payer: BC Managed Care – PPO | Admitting: Nurse Practitioner

## 2019-04-27 VITALS — BP 110/70 | HR 80 | Temp 98.4°F | Ht 64.2 in | Wt 172.6 lb

## 2019-04-27 DIAGNOSIS — E559 Vitamin D deficiency, unspecified: Secondary | ICD-10-CM

## 2019-04-27 DIAGNOSIS — Z23 Encounter for immunization: Secondary | ICD-10-CM | POA: Diagnosis not present

## 2019-04-27 DIAGNOSIS — I8393 Asymptomatic varicose veins of bilateral lower extremities: Secondary | ICD-10-CM

## 2019-04-27 DIAGNOSIS — E663 Overweight: Secondary | ICD-10-CM | POA: Diagnosis not present

## 2019-04-27 DIAGNOSIS — Z0000 Encounter for general adult medical examination without abnormal findings: Secondary | ICD-10-CM | POA: Diagnosis not present

## 2019-04-27 DIAGNOSIS — E041 Nontoxic single thyroid nodule: Secondary | ICD-10-CM

## 2019-04-27 LAB — POCT URINALYSIS DIPSTICK
Bilirubin, UA: NEGATIVE
Glucose, UA: NEGATIVE
Ketones, UA: NEGATIVE
Leukocytes, UA: NEGATIVE
Nitrite, UA: NEGATIVE
Protein, UA: NEGATIVE
Spec Grav, UA: 1.015 (ref 1.010–1.025)
Urobilinogen, UA: 0.2 E.U./dL
pH, UA: 8.5 — AB (ref 5.0–8.0)

## 2019-04-27 MED ORDER — LEVOCETIRIZINE DIHYDROCHLORIDE 5 MG PO TABS: 5.0000 mg | ORAL_TABLET | Freq: Every evening | ORAL | 1 refills | Status: DC

## 2019-04-27 NOTE — Patient Instructions (Addendum)
Health Maintenance  Topic Date Due  . PAP SMEAR-Modifier  01/14/2015  . TETANUS/TDAP  07/23/2019  . INFLUENZA VACCINE  Completed  . HIV Screening  Completed   Health Maintenance, Female Adopting a healthy lifestyle and getting preventive care are important in promoting health and wellness. Ask your health care provider about:  The right schedule for you to have regular tests and exams.  Things you can do on your own to prevent diseases and keep yourself healthy. What should I know about diet, weight, and exercise? Eat a healthy diet   Eat a diet that includes plenty of vegetables, fruits, low-fat dairy products, and lean protein.  Do not eat a lot of foods that are high in solid fats, added sugars, or sodium. Maintain a healthy weight Body mass index (BMI) is used to identify weight problems. It estimates body fat based on height and weight. Your health care provider can help determine your BMI and help you achieve or maintain a healthy weight. Get regular exercise Get regular exercise. This is one of the most important things you can do for your health. Most adults should:  Exercise for at least 150 minutes each week. The exercise should increase your heart rate and make you sweat (moderate-intensity exercise).  Do strengthening exercises at least twice a week. This is in addition to the moderate-intensity exercise.  Spend less time sitting. Even light physical activity can be beneficial. Watch cholesterol and blood lipids Have your blood tested for lipids and cholesterol at 38 years of age, then have this test every 5 years. Have your cholesterol levels checked more often if:  Your lipid or cholesterol levels are high.  You are older than 38 years of age.  You are at high risk for heart disease. What should I know about cancer screening? Depending on your health history and family history, you may need to have cancer screening at various ages. This may include screening for:   Breast cancer.  Cervical cancer.  Colorectal cancer.  Skin cancer.  Lung cancer. What should I know about heart disease, diabetes, and high blood pressure? Blood pressure and heart disease  High blood pressure causes heart disease and increases the risk of stroke. This is more likely to develop in people who have high blood pressure readings, are of African descent, or are overweight.  Have your blood pressure checked: ? Every 3-5 years if you are 65-12 years of age. ? Every year if you are 44 years old or older. Diabetes Have regular diabetes screenings. This checks your fasting blood sugar level. Have the screening done:  Once every three years after age 43 if you are at a normal weight and have a low risk for diabetes.  More often and at a younger age if you are overweight or have a high risk for diabetes. What should I know about preventing infection? Hepatitis B If you have a higher risk for hepatitis B, you should be screened for this virus. Talk with your health care provider to find out if you are at risk for hepatitis B infection. Hepatitis C Testing is recommended for:  Everyone born from [birth year] through 1965.  Anyone with known risk factors for hepatitis C. Sexually transmitted infections (STIs)  Get screened for STIs, including gonorrhea and chlamydia, if: ? You are sexually active and are younger than 38 years of age. ? You are older than 38 years of age and your health care provider tells you that you are at risk for  this type of infection. ? Your sexual activity has changed since you were last screened, and you are at increased risk for chlamydia or gonorrhea. Ask your health care provider if you are at risk.  Ask your health care provider about whether you are at high risk for HIV. Your health care provider may recommend a prescription medicine to help prevent HIV infection. If you choose to take medicine to prevent HIV, you should first get tested for HIV. You  should then be tested every 3 months for as long as you are taking the medicine. Pregnancy  If you are about to stop having your period (premenopausal) and you may become pregnant, seek counseling before you get pregnant.  Take 400 to 800 micrograms (mcg) of folic acid every day if you become pregnant.  Ask for birth control (contraception) if you want to prevent pregnancy. Osteoporosis and menopause Osteoporosis is a disease in which the bones lose minerals and strength with aging. This can result in bone fractures. If you are 31 years old or older, or if you are at risk for osteoporosis and fractures, ask your health care provider if you should:  Be screened for bone loss.  Take a calcium or vitamin D supplement to lower your risk of fractures.  Be given hormone replacement therapy (HRT) to treat symptoms of menopause. Follow these instructions at home: Lifestyle  Do not use any products that contain nicotine or tobacco, such as cigarettes, e-cigarettes, and chewing tobacco. If you need help quitting, ask your health care provider.  Do not use street drugs.  Do not share needles.  Ask your health care provider for help if you need support or information about quitting drugs. Alcohol use  Do not drink alcohol if: ? Your health care provider tells you not to drink. ? You are pregnant, may be pregnant, or are planning to become pregnant.  If you drink alcohol: ? Limit how much you use to 0-1 drink a day. ? Limit intake if you are breastfeeding.  Be aware of how much alcohol is in your drink. In the [place] one drink equals one 12 oz bottle of beer (355 mL), one 5 oz glass of wine (148 mL), or one 1 oz glass of hard liquor (44 mL). General instructions  Schedule regular health, dental, and eye exams.  Stay current with your vaccines.  Tell your health care provider if: ? You often feel depressed. ? You have ever been abused or do not feel safe at home. Summary  Adopting a  healthy lifestyle and getting preventive care are important in promoting health and wellness.  Follow your health care provider's instructions about healthy diet, exercising, and getting tested or screened for diseases.  Follow your health care provider's instructions on monitoring your cholesterol and blood pressure. This information is not intended to replace advice given to you by your health care provider. Make sure you discuss any questions you have with your health care provider. Document Released: 01/21/2011 Document Revised: 07/01/2018 Document Reviewed: 07/01/2018 Elsevier Patient Education  2020 [employer].   Varicose Veins Varicose veins are veins that have become enlarged, bulged, and twisted. They most often appear in the legs. What are the causes? This condition is caused by damage to the valves in the vein. These valves help blood return to your heart. When they are damaged and they stop working properly, blood may flow backward and back up in the veins near the skin, causing the veins to get larger and  appear twisted. The condition can result from any issue that causes blood to back up, like pregnancy, prolonged standing, or obesity. What increases the risk? This condition is more likely to develop in people who are:  On their feet a lot.  Pregnant.  Overweight. What are the signs or symptoms? Symptoms of this condition include:  Bulging, twisted, and bluish veins.  A feeling of heaviness. This may be worse at the end of the day.  Leg pain. This may be worse at the end of the day.  Swelling in the leg.  Changes in skin color over the veins. How is this diagnosed? This condition may be diagnosed based on your symptoms, a physical exam, and an ultrasound test. How is this treated? Treatment for this condition may involve:  Avoiding sitting or standing in one position for long periods of time.  Wearing compression stockings. These stockings help to prevent  blood clots and reduce swelling in the legs.  Raising (elevating) the legs when resting.  Losing weight.  Exercising regularly. If you have persistent symptoms or want to improve the way your varicose veins look, you may choose to have a procedure to close the varicose veins off or to remove them. Treatments to close off the veins include:  Sclerotherapy. In this treatment, a solution is injected into a vein to close it off.  Laser treatment. In this treatment, the vein is heated with a laser to close it off.  Radiofrequency vein ablation. In this treatment, an electrical current produced by radio waves is used to close off the vein. Treatments to remove the veins include:  Phlebectomy. In this treatment, the veins are removed through small incisions made over the veins.  Vein ligation and stripping. In this treatment, incisions are made over the veins. The veins are then removed after being tied (ligated) with stitches (sutures). Follow these instructions at home: Activity  Walk as much as possible. Walking increases blood flow. This helps blood return to the heart and takes pressure off your veins. It also increases your cardiovascular strength.  Follow your health care provider's instructions about exercising.  Do not stand or sit in one position for a long period of time.  Do not sit with your legs crossed.  Rest with your legs raised during the day. General instructions   Follow any diet instructions given to you by your health care provider.  Wear compression stockings as directed by your health care provider. Do not wear other kinds of tight clothing around your legs, pelvis, or waist.  Elevate your legs at night to above the level of your heart.  If you get a cut in the skin over the varicose vein and the vein bleeds: ? Lie down with your leg raised. ? Apply firm pressure to the cut with a clean cloth until the bleeding stops. ? Place a bandage (dressing) on the  cut. Contact a health care provider if:  The skin around your varicose veins starts to break down.  You have pain, redness, tenderness, or hard swelling over a vein.  You are uncomfortable because of pain.  You get a cut in the skin over a varicose vein and it will not stop bleeding. Summary  Varicose veins are veins that have become enlarged, bulged, and twisted. They most often appear in the legs.  This condition is caused by damage to the valves in the vein. These valves help blood return to your heart.  Treatment for this condition includes frequent movements,  wearing compression stockings, losing weight, and exercising regularly. In some cases, procedures are done to close off or remove the veins.  Treatment for this condition may include wearing compression stockings, elevating the legs, losing weight, and engaging in regular activity. In some cases, procedures are done to close off or remove the veins.  This information is not intended to replace advice given to you by your health care provider. Make sure you discuss any questions you have with your health care provider. Document Released: 04/17/2005 Document Revised: 09/03/2018 Document Reviewed: 07/31/2016 Elsevier Patient Education  2020 [place] [employer]

## 2019-04-27 NOTE — Progress Notes (Signed)
Subjective:     Patient ID: Hannah Knapp , female    DOB: [birth year] , 38 y.o.   MRN: [id number]   Chief Complaint  Patient presents with  . Annual Exam    HPI  Here for HM  Wt Readings from Last 3 Encounters: 04/27/19 : 172 lb 9.6 oz (78.3 kg) 08/06/18 : 170 lb 6.4 oz (77.3 kg) 05/27/18 : 170 lb 12.8 oz (77.5 kg)  She was to get a scan of her thyroid in March but was cancelled - she will follow up with Dr. Chalmers Cater  She has multiple varicose veins to her thighs left worse than    The patient states she uses none for birth control. Last LMP was Patient's last menstrual period was 04/24/2019.. Negative for Dysmenorrhea and Negative for Menorrhagia Mammogram never done, maternal great grandmother passed from breast cancer.  Negative for: breast discharge, breast lump(s), breast pain and breast self exam.  Pertinent negatives include abnormal bleeding (hematology), anxiety, decreased libido, depression, difficulty falling sleep, dyspareunia, history of infertility, nocturia, sexual dysfunction, sleep disturbances, urinary incontinence, urinary urgency, vaginal discharge and vaginal itching. Diet regular, she has cut back on gluten. The patient states her exercise level is  minimal - two times a week will walk 30-40 minutes per week.    The patient's tobacco use is:  Social History   Tobacco Use  Smoking Status Never Smoker  Smokeless Tobacco Never Used   She has been exposed to passive smoke. The patient's alcohol use is:   Social History   Substance and Sexual Activity  Alcohol Use No   Additional information: Last pap July 2018 - was seeing Dr. Raphael Gibney.   Past Medical History:  Diagnosis Date  . Fibroid   . Hypothyroidism   . Thyroid nodule      Family History  Problem Relation Age of Onset  . Hypertension Maternal Grandmother   . Migraines Maternal Grandmother   . Fibromyalgia Maternal Grandmother   . Depression Maternal Grandmother   . Heart disease Maternal  Grandfather      Current Outpatient Medications:  .  EPINEPHrine (EPIPEN 2-PAK) 0.3 mg/0.3 mL IJ SOAJ injection, EpiPen 2-Pak 0.3 mg/0.3 mL injection, auto-injector, Disp: , Rfl:  .  levocetirizine (XYZAL) 5 MG tablet, TAKE 1 TABLET BY MOUTH EVERY DAY IN THE EVENING, Disp: 90 tablet, Rfl: 0 .  ibuprofen (ADVIL,MOTRIN) 600 MG tablet, Take 1 tablet (600 mg total) by mouth every 8 (eight) hours as needed for moderate pain. (Patient not taking: Reported on 04/27/2019), Disp: 30 tablet, Rfl: 0   Allergies  Allergen Reactions  . Azithromycin Other (See Comments)    Makes patient very sick  . Other Hives and Itching    Shellfish and allergic to molds and pet dander     Review of Systems  Constitutional: Negative.   HENT: Negative.   Eyes: Negative.   Respiratory: Negative.   Cardiovascular: Negative.   Gastrointestinal: Negative.   Endocrine: Negative.   Genitourinary: Negative.   Musculoskeletal: Negative.   Skin: Negative.   Allergic/Immunologic: Negative.   Neurological: Negative.   Hematological: Negative.   Psychiatric/Behavioral: Negative.      Today's Vitals   04/27/19 0854  BP: 110/70  Pulse: 80  Temp: 98.4 F (36.9 C)  TempSrc: Oral  Weight: 172 lb 9.6 oz (78.3 kg)  Height: 5' 4.2" (1.631 m)  PainSc: 0-No pain   Body mass index is 29.44 kg/m.   Objective:  Physical Exam Constitutional:  Appearance: Normal appearance. She is well-developed.  HENT:     Head: Normocephalic and atraumatic.     Right Ear: Hearing, tympanic membrane, ear canal and external ear normal.     Left Ear: Hearing, tympanic membrane, ear canal and external ear normal.  Eyes:     General: Lids are normal.     Extraocular Movements: Extraocular movements intact.     Conjunctiva/sclera: Conjunctivae normal.     Pupils: Pupils are equal, round, and reactive to light.     Funduscopic exam:    Right eye: No papilledema.        Left eye: No papilledema.  Neck:     Musculoskeletal:  Full passive range of motion without pain, normal range of motion and neck supple.     Thyroid: Thyromegaly present. No thyroid mass or thyroid tenderness.     Vascular: No carotid bruit.  Cardiovascular:     Rate and Rhythm: Normal rate and regular rhythm.     Pulses: Normal pulses.     Heart sounds: Normal heart sounds. No murmur.  Pulmonary:     Effort: Pulmonary effort is normal.     Breath sounds: Normal breath sounds.  Chest:     Breasts:        Right: Normal. No mass.        Left: Normal. No mass or tenderness.  Abdominal:     General: Abdomen is flat. Bowel sounds are normal.     Palpations: Abdomen is soft.  Musculoskeletal: Normal range of motion.        General: No swelling.     Right lower leg: No edema.     Left lower leg: No edema.  Lymphadenopathy:     Upper Body:     Right upper body: No supraclavicular adenopathy.     Left upper body: No supraclavicular adenopathy.  Skin:    General: Skin is warm and dry.     Capillary Refill: Capillary refill takes less than 2 seconds.     Comments: Varicose veins present to bilateral thighs with the left worse than right  Neurological:     General: No focal deficit present.     Mental Status: She is alert and oriented to person, place, and time.     Cranial Nerves: No cranial nerve deficit.     Sensory: No sensory deficit.  Psychiatric:        Mood and Affect: Mood normal.        Behavior: Behavior normal.        Thought Content: Thought content normal.        Judgment: Judgment normal.         Assessment And Plan:     1. Encounter for general adult medical examination w/o abnormal findings . Behavior modifications discussed and diet history reviewed.   . Pt will continue to exercise regularly and modify diet with low GI, plant based foods and decrease intake of processed foods.  . Recommend intake of daily multivitamin, Vitamin D, and calcium.  . Recommend  for preventive screenings, as well as recommend  immunizations that include influenza, TDAP - POCT Urinalysis Dipstick (81002)  2. Need for influenza vaccination  Influenza vaccine given in office  Advised to take Tylenol as needed for muscle aches or fever - Flu Vaccine QUAD 6+ mos PF IM (Fluarix Quad PF)  3. Thyroid nodule  She has thyroid nodule and irregularity to her right neck being followed by Dr. Chalmers Cater  She is due  for another thyroid ultrasound   4. Overweight (BMI 25.0-29.9)  Encouraged to incorporate more physical activity  - Hemoglobin A1c  5. Vitamin D deficiency  Will check vitamin D level and supplement as needed.     Also encouraged to spend 15 minutes in the sun daily.  - Vitamin D (25 hydroxy)  6. Asymptomatic varicose veins of both lower extremities  Noted to bilateral thighs with left worse than right  Encouraged to wear support socks and try to avoid standing or sitting for long periods  Encouraged weight loss - CBC no Diff       Minette Brine, FNP    THE PATIENT IS ENCOURAGED TO PRACTICE SOCIAL DISTANCING DUE TO THE COVID-19 PANDEMIC.

## 2019-04-28 ENCOUNTER — Other Ambulatory Visit: Payer: Self-pay

## 2019-04-28 ENCOUNTER — Other Ambulatory Visit: Payer: Self-pay | Admitting: Nurse Practitioner

## 2019-04-28 LAB — BMP8+ANION GAP
Anion Gap: 11 mmol/L (ref 10.0–18.0)
BUN/Creatinine Ratio: 12 (ref 9–23)
BUN: 10 mg/dL (ref 6–20)
CO2: 25 mmol/L (ref 20–29)
Calcium: 9.5 mg/dL (ref 8.7–10.2)
Chloride: 102 mmol/L (ref 96–106)
Creatinine, Ser: 0.84 mg/dL (ref 0.57–1.00)
GFR calc Af Amer: 102 mL/min/{1.73_m2} (ref >59)
GFR calc non Af Amer: 88 mL/min/{1.73_m2} (ref >59)
Glucose: 81 mg/dL (ref 65–99)
Potassium: 4.5 mmol/L (ref 3.5–5.2)
Sodium: 138 mmol/L (ref 134–144)

## 2019-04-28 LAB — HEMOGLOBIN A1C
Est. average glucose Bld gHb Est-mCnc: 94 mg/dL
Hgb A1c MFr Bld: 4.9 % (ref 4.8–5.6)

## 2019-04-28 LAB — LIPID PANEL
Chol/HDL Ratio: 3 ratio (ref 0.0–4.4)
Cholesterol, Total: 164 mg/dL (ref 100–199)
HDL: 54 mg/dL (ref >39)
LDL Chol Calc (NIH): 96 mg/dL (ref 0–99)
Triglycerides: 71 mg/dL (ref 0–149)
VLDL Cholesterol Cal: 14 mg/dL (ref 5–40)

## 2019-04-28 LAB — CBC
Hematocrit: 40 % (ref 34.0–46.6)
Hemoglobin: 13 g/dL (ref 11.1–15.9)
MCH: 29.1 pg (ref 26.6–33.0)
MCHC: 32.5 g/dL (ref 31.5–35.7)
MCV: 90 fL (ref 79–97)
Platelets: 292 10*3/uL (ref 150–450)
RBC: 4.46 x10E6/uL (ref 3.77–5.28)
RDW: 11.8 % (ref 11.7–15.4)
WBC: 5.9 10*3/uL (ref 3.4–10.8)

## 2019-04-28 LAB — TSH: TSH: 1.04 u[IU]/mL (ref 0.450–4.500)

## 2019-04-28 LAB — VITAMIN D 25 HYDROXY (VIT D DEFICIENCY, FRACTURES): Vit D, 25-Hydroxy: 22.9 ng/mL — ABNORMAL LOW (ref 30.0–100.0)

## 2019-04-28 MED ORDER — VITAMIN D (ERGOCALCIFEROL) 1.25 MG (50000 UNIT) PO CAPS: 50000.0000 [IU] | ORAL_CAPSULE | ORAL | 0 refills | Status: DC

## 2019-04-28 MED ORDER — EPINEPHRINE 0.3 MG/0.3ML IJ SOAJ: INTRAMUSCULAR | 0 refills | Status: DC

## 2019-07-23 ENCOUNTER — Other Ambulatory Visit: Payer: Self-pay | Admitting: Nurse Practitioner

## 2019-08-20 ENCOUNTER — Ambulatory Visit
Admission: RE | Admit: 2019-08-20 | Discharge: 2019-08-20 | Disposition: A | Discharge status: Home / Self Care | Payer: BC Managed Care – PPO | Source: Ambulatory Visit | Attending: Endocrinology | Admitting: Endocrinology

## 2019-08-20 DIAGNOSIS — E041 Nontoxic single thyroid nodule: Secondary | ICD-10-CM

## 2019-09-30 ENCOUNTER — Other Ambulatory Visit: Payer: Self-pay | Admitting: Endocrinology

## 2019-09-30 DIAGNOSIS — E041 Nontoxic single thyroid nodule: Secondary | ICD-10-CM

## 2019-10-28 ENCOUNTER — Ambulatory Visit: Payer: BC Managed Care – PPO | Admitting: Internal Medicine

## 2019-11-24 ENCOUNTER — Ambulatory Visit: Payer: BC Managed Care – PPO | Admitting: Internal Medicine

## 2020-01-13 ENCOUNTER — Encounter: Payer: Self-pay | Admitting: Nurse Practitioner

## 2020-01-13 ENCOUNTER — Ambulatory Visit: Payer: BC Managed Care – PPO | Admitting: Nurse Practitioner

## 2020-01-13 ENCOUNTER — Other Ambulatory Visit (HOSPITAL_COMMUNITY)
Admission: RE | Admit: 2020-01-13 | Discharge: 2020-01-13 | Disposition: A | Discharge status: Home / Self Care | Payer: BC Managed Care – PPO | Source: Ambulatory Visit | Attending: Nurse Practitioner | Admitting: Nurse Practitioner

## 2020-01-13 ENCOUNTER — Other Ambulatory Visit: Payer: Self-pay

## 2020-01-13 VITALS — BP 116/74 | HR 83 | Temp 98.3°F | Ht 63.0 in | Wt 173.0 lb

## 2020-01-13 DIAGNOSIS — N898 Other specified noninflammatory disorders of vagina: Secondary | ICD-10-CM | POA: Diagnosis not present

## 2020-01-13 DIAGNOSIS — R350 Frequency of micturition: Secondary | ICD-10-CM

## 2020-01-13 LAB — POCT URINALYSIS DIPSTICK
Bilirubin, UA: NEGATIVE
Blood, UA: NEGATIVE
Glucose, UA: NEGATIVE
Ketones, UA: NEGATIVE
Nitrite, UA: NEGATIVE
Protein, UA: NEGATIVE
Spec Grav, UA: 1.01 (ref 1.010–1.025)
Urobilinogen, UA: 0.2 E.U./dL
pH, UA: 5.5 (ref 5.0–8.0)

## 2020-01-13 MED ORDER — NITROFURANTOIN MONOHYD MACRO 100 MG PO CAPS: 100.0000 mg | ORAL_CAPSULE | Freq: Two times a day (BID) | ORAL | 0 refills | Status: AC

## 2020-01-15 ENCOUNTER — Encounter: Payer: Self-pay | Admitting: Nurse Practitioner

## 2020-01-17 ENCOUNTER — Encounter: Payer: Self-pay | Admitting: Nurse Practitioner

## 2020-01-17 LAB — CERVICOVAGINAL ANCILLARY ONLY
Bacterial Vaginitis (gardnerella): NEGATIVE
Candida Glabrata: NEGATIVE
Candida Vaginitis: NEGATIVE
Chlamydia: NEGATIVE
Comment: NEGATIVE
Comment: NEGATIVE
Comment: NEGATIVE
Comment: NEGATIVE
Comment: NEGATIVE
Comment: NORMAL
Neisseria Gonorrhea: NEGATIVE
Trichomonas: NEGATIVE

## 2020-02-06 NOTE — Progress Notes (Signed)
This visit occurred during the SARS-CoV-2 public health emergency.  Safety protocols were in place, including screening questions prior to the visit, additional usage of staff PPE, and extensive cleaning of exam room while observing appropriate contact time as indicated for disinfecting solutions.  Subjective:     Patient ID: Hannah Knapp , female    DOB: 07/20/1981 , 39 y.o.   MRN: [id number]   Chief Complaint  Patient presents with  . Abdominal Pain    patient stated she has been having some lower abdominal pain and urinary frequency     HPI  She is also complaining of feeiling like her vaginal odor seems stronger than normal. LMP - June 8th.     Abdominal Pain This is a new problem. The current episode started in the past 7 days. The onset quality is sudden. The problem has been waxing and waning. Pain location: lower abdomen pressure. The pain is moderate. Quality: pressure. The abdominal pain does not radiate. Pertinent negatives include no anorexia, constipation, dysuria, frequency, headaches, nausea or vomiting. Nothing aggravates the pain. Treatments tried: avoiding eating greasy foods. The treatment provided mild relief.     Past Medical History:  Diagnosis Date  . Fibroid   . Hypothyroidism   . Thyroid nodule      Family History  Problem Relation Age of Onset  . Hypertension Maternal Grandmother   . Migraines Maternal Grandmother   . Fibromyalgia Maternal Grandmother   . Depression Maternal Grandmother   . Heart disease Maternal Grandfather      Current Outpatient Medications:  .  EPINEPHrine (EPIPEN 2-PAK) 0.3 mg/0.3 mL IJ SOAJ injection, EpiPen 2-Pak 0.3 mg/0.3 mL injection, auto-injector, Disp: 2 each, Rfl: 0 .  levocetirizine (XYZAL) 5 MG tablet, Take 1 tablet (5 mg total) by mouth every evening., Disp: 90 tablet, Rfl: 1 .  ibuprofen (ADVIL,MOTRIN) 600 MG tablet, Take 1 tablet (600 mg total) by mouth every 8 (eight) hours as needed for moderate pain.  (Patient not taking: Reported on 04/27/2019), Disp: 30 tablet, Rfl: 0 .  Vitamin D, Ergocalciferol, (DRISDOL) 1.25 MG (50000 UT) CAPS capsule, TAKE 1 CAPSULE (50,000 UNITS TOTAL) BY MOUTH EVERY 7 (SEVEN) DAYS. (Patient not taking: Reported on 01/12/2020), Disp: 12 capsule, Rfl: 0   Allergies  Allergen Reactions  . Azithromycin Other (See Comments)    Makes patient very sick  . Other Hives and Itching    Shellfish and allergic to molds and pet dander     Review of Systems  Constitutional: Negative.   Respiratory: Negative.   Gastrointestinal: Positive for abdominal pain (lower abdomen pressure). Negative for anorexia, constipation, nausea and vomiting.  Genitourinary: Negative for difficulty urinating, dyspareunia, dysuria, flank pain, frequency and urgency.       Vaginal odor   Neurological: Negative for headaches.  All other systems reviewed and are negative.    Today's Vitals   01/13/20 1506  BP: 116/74  Pulse: 83  Temp: 98.3 F (36.8 C)  TempSrc: Oral  SpO2: 96%  Weight: 173 lb (78.5 kg)  Height: 5\' 3"  (1.6 m)  PainSc: 4   PainLoc: Abdomen   Body mass index is 30.65 kg/m.   Objective:  Physical Exam Vitals reviewed.  Constitutional:      Appearance: She is well-developed.  Abdominal:     General: Bowel sounds are normal.     Palpations: Abdomen is soft. There is no shifting dullness.  Neurological:     Mental Status: She is alert.  Assessment And Plan:    1. Urinary frequency  Urinalysis shows trace leukocytes  Will send for culture  Encouraged to increase water intake and can drink cranberry juice - POCT Urinalysis Dipstick (81002) - Culture, Urine  2. Vaginal odor  Will send for cytology to check for STDs - Cervicovaginal ancillary only   Patient was given opportunity to ask questions. Patient verbalized understanding of the plan and was able to repeat key elements of the plan. All questions were answered to their satisfaction.    Minette Brine, FNP    THE PATIENT IS ENCOURAGED TO PRACTICE SOCIAL DISTANCING DUE TO THE COVID-19 PANDEMIC.

## 2020-03-07 ENCOUNTER — Encounter: Payer: Self-pay | Admitting: Nurse Practitioner

## 2020-04-05 ENCOUNTER — Other Ambulatory Visit: Payer: Self-pay | Admitting: Endocrinology

## 2020-04-05 DIAGNOSIS — E041 Nontoxic single thyroid nodule: Secondary | ICD-10-CM

## 2020-04-26 ENCOUNTER — Ambulatory Visit
Admission: RE | Admit: 2020-04-26 | Discharge: 2020-04-26 | Disposition: A | Discharge status: Home / Self Care | Payer: BC Managed Care – PPO | Source: Ambulatory Visit | Attending: Endocrinology | Admitting: Endocrinology

## 2020-04-26 ENCOUNTER — Other Ambulatory Visit (HOSPITAL_COMMUNITY)
Admission: RE | Admit: 2020-04-26 | Discharge: 2020-04-26 | Disposition: A | Discharge status: Home / Self Care | Payer: BC Managed Care – PPO | Source: Ambulatory Visit | Attending: Endocrinology | Admitting: Endocrinology

## 2020-04-26 DIAGNOSIS — E041 Nontoxic single thyroid nodule: Secondary | ICD-10-CM

## 2020-04-27 LAB — CYTOLOGY - NON PAP

## 2020-05-10 ENCOUNTER — Encounter: Payer: Self-pay | Admitting: Internal Medicine

## 2020-05-10 ENCOUNTER — Other Ambulatory Visit: Payer: Self-pay

## 2020-05-10 ENCOUNTER — Ambulatory Visit (INDEPENDENT_AMBULATORY_CARE_PROVIDER_SITE_OTHER): Payer: BC Managed Care – PPO | Admitting: Internal Medicine

## 2020-05-10 VITALS — BP 112/60 | HR 92 | Temp 98.4°F | Ht 62.4 in | Wt 172.2 lb

## 2020-05-10 DIAGNOSIS — E6609 Other obesity due to excess calories: Secondary | ICD-10-CM

## 2020-05-10 DIAGNOSIS — Z23 Encounter for immunization: Secondary | ICD-10-CM

## 2020-05-10 DIAGNOSIS — E559 Vitamin D deficiency, unspecified: Secondary | ICD-10-CM | POA: Diagnosis not present

## 2020-05-10 DIAGNOSIS — Z6831 Body mass index (BMI) 31.0-31.9, adult: Secondary | ICD-10-CM

## 2020-05-10 DIAGNOSIS — Z0000 Encounter for general adult medical examination without abnormal findings: Secondary | ICD-10-CM | POA: Diagnosis not present

## 2020-05-10 MED ORDER — LEVOCETIRIZINE DIHYDROCHLORIDE 5 MG PO TABS: 5.0000 mg | ORAL_TABLET | Freq: Every evening | ORAL | 1 refills | Status: DC

## 2020-05-10 MED ORDER — EPINEPHRINE 0.3 MG/0.3ML IJ SOAJ: INTRAMUSCULAR | 1 refills | Status: DC

## 2020-05-10 NOTE — Progress Notes (Signed)
I,Hannah Knapp,acting as a Education administrator for Hannah Greenland, MD.,have documented all relevant documentation on the behalf of Hannah Greenland, MD,as directed by  Hannah Greenland, MD while in the presence of Hannah Greenland, MD.  This visit occurred during the SARS-CoV-2 public health emergency.  Safety protocols were in place, including screening questions prior to the visit, additional usage of staff PPE, and extensive cleaning of exam room while observing appropriate contact time as indicated for disinfecting solutions.  Subjective:     Patient ID: Hannah Knapp , female    DOB: [birth year] , 39 y.o.   MRN: [id number]   Chief Complaint  Patient presents with  . Annual Exam    HPI  The patient is here today for a physical examination.  [facility]    Past Medical History:  Diagnosis Date  . Fibroid   . Hypothyroidism   . Thyroid nodule      Family History  Problem Relation Age of Onset  . Hypertension Maternal Grandmother   . Migraines Maternal Grandmother   . Fibromyalgia Maternal Grandmother   . Depression Maternal Grandmother   . Heart disease Maternal Grandfather      Current Outpatient Medications:  .  EPINEPHrine (EPIPEN 2-PAK) 0.3 mg/0.3 mL IJ SOAJ injection, EpiPen 2-Pak 0.3 mg/0.3 mL injection, auto-injector, Disp: 2 each, Rfl: 1 .  levocetirizine (XYZAL) 5 MG tablet, Take 1 tablet (5 mg total) by mouth every evening., Disp: 90 tablet, Rfl: 1 .  ibuprofen (ADVIL,MOTRIN) 600 MG tablet, Take 1 tablet (600 mg total) by mouth every 8 (eight) hours as needed for moderate pain. (Patient not taking: Reported on 04/27/2019), Disp: 30 tablet, Rfl: 0 .  Vitamin D, Ergocalciferol, (DRISDOL) 1.25 MG (50000 UT) CAPS capsule, TAKE 1 CAPSULE (50,000 UNITS TOTAL) BY MOUTH EVERY 7 (SEVEN) DAYS. (Patient not taking: Reported on 01/12/2020), Disp: 12 capsule, Rfl: 0   Allergies  Allergen Reactions  . Azithromycin Other (See  Comments)    Makes patient very sick  . Other Hives and Itching    Shellfish and allergic to molds and pet dander      The patient states she uses none for birth control. Last LMP was Patient's last menstrual period was 04/18/2020.. Negative for Dysmenorrhea. Negative for: breast discharge, breast lump(s), breast pain and breast self exam. Associated symptoms include abnormal vaginal bleeding. Pertinent negatives include abnormal bleeding (hematology), anxiety, decreased libido, depression, difficulty falling sleep, dyspareunia, history of infertility, nocturia, sexual dysfunction, sleep disturbances, urinary incontinence, urinary urgency, vaginal discharge and vaginal itching. Diet regular.The patient states her exercise level is   intermittent.  . The patient's tobacco use is:  Social History   Tobacco Use  Smoking Status Never Smoker  Smokeless Tobacco Never Used  . She has been exposed to passive smoke. The patient's alcohol use is:  Social History   Substance and Sexual Activity  Alcohol Use No  Last pap was in July 2019.   Review of Systems  Constitutional: Negative.   HENT: Negative.   Eyes: Negative.   Respiratory: Negative.   Cardiovascular: Negative.   Gastrointestinal: Negative.   Endocrine: Negative.   Genitourinary: Negative.   Musculoskeletal: Negative.   Skin: Negative.   Allergic/Immunologic: Negative.   Neurological: Negative.   Hematological: Negative.   Psychiatric/Behavioral: Negative.      Today's Vitals   05/10/20 0903  BP: 112/60  Pulse: 92  Temp: 98.4 F (36.9 C)  TempSrc: Oral  Weight: 172 lb 3.2 oz (78.1 kg)  Height: 5' 2.4" (1.585 m)   Body mass index is 31.09 kg/m.  Wt Readings from Last 3 Encounters:  05/10/20 172 lb 3.2 oz (78.1 kg)  01/13/20 173 lb (78.5 kg)  04/27/19 172 lb 9.6 oz (78.3 kg)   Objective:  Physical Exam Constitutional:      General: She is not in acute distress.    Appearance: Normal appearance. She is  well-developed.  HENT:     Head: Normocephalic and atraumatic.     Right Ear: Hearing, tympanic membrane, ear canal and external ear normal. There is no impacted cerumen.     Left Ear: Hearing, tympanic membrane, ear canal and external ear normal. There is no impacted cerumen.     Nose:     Comments: Deferred, masked    Mouth/Throat:     Comments: Deferred, masked Eyes:     General: Lids are normal.     Extraocular Movements: Extraocular movements intact.     Conjunctiva/sclera: Conjunctivae normal.     Pupils: Pupils are equal, round, and reactive to light.     Funduscopic exam:    Right eye: No papilledema.        Left eye: No papilledema.  Neck:     Thyroid: Thyromegaly present. No thyroid mass.     Vascular: No carotid bruit.  Cardiovascular:     Rate and Rhythm: Normal rate and regular rhythm.     Pulses: Normal pulses.     Heart sounds: Normal heart sounds. No murmur heard.   Pulmonary:     Effort: Pulmonary effort is normal.     Breath sounds: Normal breath sounds.  Chest:     Breasts: Tanner Score is 5.        Right: Normal.        Left: Normal.  Abdominal:     General: Abdomen is flat. Bowel sounds are normal. There is no distension.     Palpations: Abdomen is soft.     Tenderness: There is no abdominal tenderness.  Musculoskeletal:        General: No swelling. Normal range of motion.     Cervical back: Full passive range of motion without pain, normal range of motion and neck supple.     Right lower leg: No edema.     Left lower leg: No edema.  Skin:    General: Skin is warm and dry.     Capillary Refill: Capillary refill takes less than 2 seconds.  Neurological:     General: No focal deficit present.     Mental Status: She is alert and oriented to person, place, and time.     Cranial Nerves: No cranial nerve deficit.     Sensory: No sensory deficit.  Psychiatric:        Mood and Affect: Mood normal.        Behavior: Behavior normal.        Thought  Content: Thought content normal.        Judgment: Judgment normal.         Assessment And Plan:     1. Encounter for general adult medical examination w/o abnormal findings Comments: A full exam was performed. Importance of monthly self breast exams was discussed with the patient. PATIENT IS ADVISED TO GET 30-45 MINUTES REGULAR EXERCISE NO LESS THAN FOUR TO FIVE DAYS PER WEEK - BOTH WEIGHTBEARING EXERCISES AND AEROBIC ARE RECOMMENDED.  PATIENT IS ADVISED TO FOLLOW A HEALTHY DIET  WITH AT LEAST SIX FRUITS/VEGGIES PER DAY, DECREASE INTAKE OF RED MEAT, AND TO INCREASE FISH INTAKE TO TWO DAYS PER WEEK.  MEATS/FISH SHOULD NOT BE FRIED, BAKED OR BROILED IS PREFERABLE.  I SUGGEST WEARING SPF 50 SUNSCREEN ON EXPOSED PARTS AND ESPECIALLY WHEN IN THE DIRECT SUNLIGHT FOR AN EXTENDED PERIOD OF TIME.  PLEASE AVOID FAST FOOD RESTAURANTS AND INCREASE YOUR WATER INTAKE.  - CBC - CMP14+EGFR - Lipid panel - Hepatitis C antibody  2. Vitamin D deficiency Comments: I will check a vitamin D level and supplement as needed.  - VITAMIN D 25 Hydroxy (Vit-D Deficiency, Fractures)  3. Class 1 obesity due to excess calories with serious comorbidity and body mass index (BMI) of 31.0 to 31.9 in adult Comments: She is encouraged to strive for BMI less than 27 to decrease cardiac risk. Advised to incorporate more exercise into her daily routine, aiming for 150 min/wk.   4. Immunization due Comments: She was given Tdap to update her immunization history.  - Tdap vaccine greater than or equal to 7yo IM    Patient was given opportunity to ask questions. Patient verbalized understanding of the plan and was able to repeat key elements of the plan. All questions were answered to their satisfaction.   Hannah Greenland, MD   I, Hannah Greenland, MD, have reviewed all documentation for this visit. The documentation on 05/10/20 for the exam, diagnosis, procedures, and orders are all accurate and complete.  THE PATIENT IS  ENCOURAGED TO PRACTICE SOCIAL DISTANCING DUE TO THE COVID-19 PANDEMIC.

## 2020-05-10 NOTE — Patient Instructions (Addendum)
[facility]  [email or web address]  Reminder - Contact Dr. Celene Kras for chiropractic care   Health Maintenance, Female Adopting a healthy lifestyle and getting preventive care are important in promoting health and wellness. Ask your health care provider about:  The right schedule for you to have regular tests and exams.  Things you can do on your own to prevent diseases and keep yourself healthy. What should I know about diet, weight, and exercise? Eat a healthy diet   Eat a diet that includes plenty of vegetables, fruits, low-fat dairy products, and lean protein.  Do not eat a lot of foods that are high in solid fats, added sugars, or sodium. Maintain a healthy weight Body mass index (BMI) is used to identify weight problems. It estimates body fat based on height and weight. Your health care provider can help determine your BMI and help you achieve or maintain a healthy weight. Get regular exercise Get regular exercise. This is one of the most important things you can do for your health. Most adults should:  Exercise for at least 150 minutes each week. The exercise should increase your heart rate and make you sweat (moderate-intensity exercise).  Do strengthening exercises at least twice a week. This is in addition to the moderate-intensity exercise.  Spend less time sitting. Even light physical activity can be beneficial. Watch cholesterol and blood lipids Have your blood tested for lipids and cholesterol at 39 years of age, then have this test every 5 years. Have your cholesterol levels checked more often if:  Your lipid or cholesterol levels are high.  You are older than 39 years of age.  You are at high risk for heart disease. What should I know about cancer screening? Depending on your health history and family history, you may need to have cancer screening at various ages. This may include screening for:  Breast cancer.  Cervical  cancer.  Colorectal cancer.  Skin cancer.  Lung cancer. What should I know about heart disease, diabetes, and high blood pressure? Blood pressure and heart disease  High blood pressure causes heart disease and increases the risk of stroke. This is more likely to develop in people who have high blood pressure readings, are of African descent, or are overweight.  Have your blood pressure checked: ? Every 3-5 years if you are 21-58 years of age. ? Every year if you are 84 years old or older. Diabetes Have regular diabetes screenings. This checks your fasting blood sugar level. Have the screening done:  Once every three years after age 51 if you are at a normal weight and have a low risk for diabetes.  More often and at a younger age if you are overweight or have a high risk for diabetes. What should I know about preventing infection? Hepatitis B If you have a higher risk for hepatitis B, you should be screened for this virus. Talk with your health care provider to find out if you are at risk for hepatitis B infection. Hepatitis C Testing is recommended for:  Everyone born from [birth year] through 1965.  Anyone with known risk factors for hepatitis C. Sexually transmitted infections (STIs)  Get screened for STIs, including gonorrhea and chlamydia, if: ? You are sexually active and are younger than 39 years of age. ? You are older than 39 years of age and your health care provider tells you that you are at risk for this type of infection. ? Your sexual activity has changed since you were  last screened, and you are at increased risk for chlamydia or gonorrhea. Ask your health care provider if you are at risk.  Ask your health care provider about whether you are at high risk for HIV. Your health care provider may recommend a prescription medicine to help prevent HIV infection. If you choose to take medicine to prevent HIV, you should first get tested for HIV. You should then be tested every 3  months for as long as you are taking the medicine. Pregnancy  If you are about to stop having your period (premenopausal) and you may become pregnant, seek counseling before you get pregnant.  Take 400 to 800 micrograms (mcg) of folic acid every day if you become pregnant.  Ask for birth control (contraception) if you want to prevent pregnancy. Osteoporosis and menopause Osteoporosis is a disease in which the bones lose minerals and strength with aging. This can result in bone fractures. If you are 58 years old or older, or if you are at risk for osteoporosis and fractures, ask your health care provider if you should:  Be screened for bone loss.  Take a calcium or vitamin D supplement to lower your risk of fractures.  Be given hormone replacement therapy (HRT) to treat symptoms of menopause. Follow these instructions at home: Lifestyle  Do not use any products that contain nicotine or tobacco, such as cigarettes, e-cigarettes, and chewing tobacco. If you need help quitting, ask your health care provider.  Do not use street drugs.  Do not share needles.  Ask your health care provider for help if you need support or information about quitting drugs. Alcohol use  Do not drink alcohol if: ? Your health care provider tells you not to drink. ? You are pregnant, may be pregnant, or are planning to become pregnant.  If you drink alcohol: ? Limit how much you use to 0-1 drink a day. ? Limit intake if you are breastfeeding.  Be aware of how much alcohol is in your drink. In the [place] one drink equals one 12 oz bottle of beer (355 mL), one 5 oz glass of wine (148 mL), or one 1 oz glass of hard liquor (44 mL). General instructions  Schedule regular health, dental, and eye exams.  Stay current with your vaccines.  Tell your health care provider if: ? You often feel depressed. ? You have ever been abused or do not feel safe at home. Summary  Adopting a healthy lifestyle and getting  preventive care are important in promoting health and wellness.  Follow your health care provider's instructions about healthy diet, exercising, and getting tested or screened for diseases.  Follow your health care provider's instructions on monitoring your cholesterol and blood pressure. This information is not intended to replace advice given to you by your health care provider. Make sure you discuss any questions you have with your health care provider. Document Revised: 07/01/2018 Document Reviewed: 07/01/2018 Elsevier Patient Education  2020 [employer].

## 2020-05-11 ENCOUNTER — Encounter: Payer: Self-pay | Admitting: Internal Medicine

## 2020-05-11 LAB — CMP14+EGFR
ALT: 5 IU/L (ref 0–32)
AST: 11 IU/L (ref 0–40)
Albumin/Globulin Ratio: 1.8 (ref 1.2–2.2)
Albumin: 4.7 g/dL (ref 3.8–4.8)
Alkaline Phosphatase: 65 IU/L (ref 44–121)
BUN/Creatinine Ratio: 14 (ref 9–23)
BUN: 11 mg/dL (ref 6–20)
Bilirubin Total: 0.4 mg/dL (ref 0.0–1.2)
CO2: 23 mmol/L (ref 20–29)
Calcium: 9.6 mg/dL (ref 8.7–10.2)
Chloride: 102 mmol/L (ref 96–106)
Creatinine, Ser: 0.79 mg/dL (ref 0.57–1.00)
GFR calc Af Amer: 109 mL/min/{1.73_m2} (ref >59)
GFR calc non Af Amer: 95 mL/min/{1.73_m2} (ref >59)
Globulin, Total: 2.6 g/dL (ref 1.5–4.5)
Glucose: 80 mg/dL (ref 65–99)
Potassium: 4.2 mmol/L (ref 3.5–5.2)
Sodium: 138 mmol/L (ref 134–144)
Total Protein: 7.3 g/dL (ref 6.0–8.5)

## 2020-05-11 LAB — LIPID PANEL
Chol/HDL Ratio: 2.9 ratio (ref 0.0–4.4)
Cholesterol, Total: 166 mg/dL (ref 100–199)
HDL: 57 mg/dL (ref >39)
LDL Chol Calc (NIH): 95 mg/dL (ref 0–99)
Triglycerides: 73 mg/dL (ref 0–149)
VLDL Cholesterol Cal: 14 mg/dL (ref 5–40)

## 2020-05-11 LAB — CBC
Hematocrit: 39.5 % (ref 34.0–46.6)
Hemoglobin: 13.2 g/dL (ref 11.1–15.9)
MCH: 29.2 pg (ref 26.6–33.0)
MCHC: 33.4 g/dL (ref 31.5–35.7)
MCV: 87 fL (ref 79–97)
Platelets: 273 10*3/uL (ref 150–450)
RBC: 4.52 x10E6/uL (ref 3.77–5.28)
RDW: 12 % (ref 11.7–15.4)
WBC: 6.5 10*3/uL (ref 3.4–10.8)

## 2020-05-11 LAB — HEPATITIS C ANTIBODY: Hep C Virus Ab: <0.1 s/co ratio (ref 0.0–0.9)

## 2020-05-11 LAB — VITAMIN D 25 HYDROXY (VIT D DEFICIENCY, FRACTURES): Vit D, 25-Hydroxy: 24.9 ng/mL — ABNORMAL LOW (ref 30.0–100.0)

## 2020-05-12 ENCOUNTER — Other Ambulatory Visit: Payer: Self-pay | Admitting: Internal Medicine

## 2020-05-12 MED ORDER — VITAMIN D (ERGOCALCIFEROL) 1.25 MG (50000 UNIT) PO CAPS: ORAL_CAPSULE | ORAL | 0 refills | Status: DC

## 2020-08-01 ENCOUNTER — Other Ambulatory Visit: Payer: Self-pay

## 2020-08-01 MED ORDER — AMOXICILLIN-POT CLAVULANATE 875-125 MG PO TABS: 1.0000 | ORAL_TABLET | Freq: Two times a day (BID) | ORAL | 0 refills | Status: AC

## 2020-08-02 ENCOUNTER — Other Ambulatory Visit: Payer: Self-pay

## 2020-08-02 ENCOUNTER — Telehealth (INDEPENDENT_AMBULATORY_CARE_PROVIDER_SITE_OTHER): Payer: BC Managed Care – PPO | Admitting: Nurse Practitioner

## 2020-08-02 ENCOUNTER — Encounter: Payer: Self-pay | Admitting: Nurse Practitioner

## 2020-08-02 VITALS — Temp 98.1°F | Wt 171.8 lb

## 2020-08-02 DIAGNOSIS — U071 COVID-19: Secondary | ICD-10-CM

## 2020-08-02 DIAGNOSIS — R059 Cough, unspecified: Secondary | ICD-10-CM

## 2020-08-02 MED ORDER — ALBUTEROL SULFATE HFA 108 (90 BASE) MCG/ACT IN AERS: 2.0000 | INHALATION_SPRAY | Freq: Four times a day (QID) | RESPIRATORY_TRACT | 2 refills | Status: DC | PRN

## 2020-08-02 NOTE — Patient Instructions (Signed)

## 2020-08-02 NOTE — Progress Notes (Signed)
Virtual Visit via video   This visit type was conducted due to national recommendations for restrictions regarding the COVID-19 Pandemic (e.g. social distancing) in an effort to limit this patient's exposure and mitigate transmission in our community.  Due to her co-morbid illnesses, this patient is at least at moderate risk for complications without adequate follow up.  This format is felt to be most appropriate for this patient at this time.  All issues noted in this document were discussed and addressed.  A limited physical exam was performed with this format.    This visit type was conducted due to national recommendations for restrictions regarding the COVID-19 Pandemic (e.g. social distancing) in an effort to limit this patient's exposure and mitigate transmission in our community.  Patients identity confirmed using two different identifiers.  This format is felt to be most appropriate for this patient at this time.  All issues noted in this document were discussed and addressed.  No physical exam was performed (except for noted visual exam findings with Video Visits).    Date:  08/02/2020   ID:  Hannah Knapp, DOB Dec 09, 1980, MRN [id number]  Patient Location:  At home   Provider location:   Office    Chief Complaint:  Tested positive on a at home COVID test.   History of Present Illness:    Hannah Knapp is a 40 y.o. female who presents via video conferencing for a telehealth visit today.   The patient have symptoms concerning for COVID-19 infection  chills, cough, sinus pressure, runny nose, sneezing. She gets "winded" easily if she tries to do something.   She tested positive Yesterday. Symptoms since Saturday-Sunday. Headache with congestion, sinus pressure, runny nose, drainage, sneezing. No fever. Bodyaches and chills. She is also has a non-productive cough. Right now her symptoms are headache, sinus congestion. She has a little bit of shortness of breath at times along with  fatigue. She has been taking sudafed, extra strength tylenol. She has been vaccinated but not had a booster shot yet.     Past Medical History:  Diagnosis Date  . Fibroid   . Hypothyroidism   . Thyroid nodule    Past Surgical History:  Procedure Laterality Date  . HYSTEROSCOPY    . MYOMECTOMY    . WISDOM TOOTH EXTRACTION       Current Meds  Medication Sig  . albuterol (VENTOLIN HFA) 108 (90 Base) MCG/ACT inhaler Inhale 2 puffs into the lungs every 6 (six) hours as needed for wheezing or shortness of breath.     Allergies:   Azithromycin and Other   Social History   Tobacco Use  . Smoking status: Never Smoker  . Smokeless tobacco: Never Used  Substance Use Topics  . Alcohol use: No  . Drug use: No     Family Hx: The patient's family history includes Depression in her maternal grandmother; Fibromyalgia in her maternal grandmother; Heart disease in her maternal grandfather; Hypertension in her maternal grandmother; Migraines in her maternal grandmother.  ROS:   Please see the history of present illness.    Review of Systems  Constitutional: Positive for chills and malaise/fatigue. Negative for fever.  HENT: Positive for congestion.        Ear pressure   Respiratory: Positive for cough and shortness of breath.        She gets easily out of breath of she has to get up and do something.   Gastrointestinal: Negative for constipation, diarrhea, nausea and vomiting.  Musculoskeletal: Positive for myalgias.  Neurological: Positive for headaches.    All other systems reviewed and are negative.   Labs/Other Tests and Data Reviewed:    Recent Labs: 05/10/2020: ALT 5; BUN 11; Creatinine, Ser 0.79; Hemoglobin 13.2; Platelets 273; Potassium 4.2; Sodium 138   Recent Lipid Panel Lab Results  Component Value Date/Time   CHOL 166 05/10/2020 09:44 AM   TRIG 73 05/10/2020 09:44 AM   HDL 57 05/10/2020 09:44 AM   CHOLHDL 2.9 05/10/2020 09:44 AM   LDLCALC 95 05/10/2020 09:44 AM     Wt Readings from Last 3 Encounters:  08/02/20 171 lb 12.8 oz (77.9 kg)  05/10/20 172 lb 3.2 oz (78.1 kg)  01/13/20 173 lb (78.5 kg)     Exam:    Vital Signs:  Temp 98.1 F (36.7 C) (Oral)   Wt 171 lb 12.8 oz (77.9 kg)   BMI 31.02 kg/m     Physical Exam Constitutional:      Appearance: Normal appearance. She is obese.  HENT:     Head: Normocephalic and atraumatic.  Pulmonary:     Effort: Pulmonary effort is normal.  Neurological:     Mental Status: She is alert.  Psychiatric:        Mood and Affect: Mood normal.        Behavior: Behavior normal.     ASSESSMENT & PLAN:     1. Lab test positive for detection of COVID-19 virus - Continue Augmentin BID x 14 days  -OTC tylenol/ibupfren every 4-6 hours as needed for fever and bodyaches  -Advised patient to take Vitamin C, D, Zinc. Keep yourself hydrated with a lot of water and rest. Take Delsym for cough and Mucinex. Take Tylenol or pain reliever every 4-6 hours as needed for pain/fever/body ache. Educated patient if symptoms get worse or if she experiences any SOB or pain in her legs to seek immediate emergency care. Continue to monitor your pulse oxygen. Call us if you have any questions. Quarantine for 5 days if tested positive and no symptoms or 10 days if tested positive and have symptoms. Wear a mask around other people.   2. Cough  -Take OTC delsym as needed for cough  -Rx sent for albuterol inhaler every 6 hours as needed  -Advised patient to take Vitamin C, D, Zinc. Take baby aspirin daily. Keep yourself hydrated with a lot of water and rest. Take Delsym for cough and Mucinex. Take Tylenol or pain reliever every 4-6 hours as needed for pain/fever/body ache. Educated patient if symptoms get worse or if she experiences any SOB or pain in her legs to seek immediate emergency care. Continue to monitor your pulse oxygen. Call us if you have any questions. Quarantine for 5 days if tested positive and no symptoms or 10 days  if tested positive and have symptoms. Wear a mask around other people.    COVID-19 Education: The signs and symptoms of COVID-19 were discussed with the patient and how to seek care for testing (follow up with PCP or arrange E-visit).  The importance of social distancing was discussed today.  Patient Risk:   After full review of this patients clinical status, I feel that they are at least moderate risk at this time.  Time:   Today, I have spent 20 minutes/ seconds with the patient with telehealth technology discussing above diagnoses.     Medication Adjustments/Labs and Tests Ordered: Current medicines are reviewed at length with the patient today.  Concerns regarding medicines  are outlined above.   Tests Ordered: No orders of the defined types were placed in this encounter.   Medication Changes: Meds ordered this encounter  Medications  . albuterol (VENTOLIN HFA) 108 (90 Base) MCG/ACT inhaler    Sig: Inhale 2 puffs into the lungs every 6 (six) hours as needed for wheezing or shortness of breath.    Dispense:  8 g    Refill:  2    Disposition:  Follow up if symptoms do not improve or get better.   Signed, Bary Castilla, NP

## 2020-11-03 ENCOUNTER — Other Ambulatory Visit: Payer: Self-pay | Admitting: Internal Medicine

## 2021-01-25 ENCOUNTER — Ambulatory Visit: Payer: BC Managed Care – PPO | Admitting: Internal Medicine

## 2021-01-25 ENCOUNTER — Other Ambulatory Visit: Payer: Self-pay

## 2021-01-25 ENCOUNTER — Encounter: Payer: Self-pay | Admitting: Internal Medicine

## 2021-01-25 VITALS — BP 114/80 | HR 77 | Temp 98.4°F | Ht 62.4 in | Wt 178.6 lb

## 2021-01-25 DIAGNOSIS — R253 Fasciculation: Secondary | ICD-10-CM

## 2021-01-25 DIAGNOSIS — M79662 Pain in left lower leg: Secondary | ICD-10-CM

## 2021-01-25 DIAGNOSIS — I781 Nevus, non-neoplastic: Secondary | ICD-10-CM

## 2021-01-25 DIAGNOSIS — R202 Paresthesia of skin: Secondary | ICD-10-CM

## 2021-01-25 MED ORDER — EPINEPHRINE 0.3 MG/0.3ML IJ SOAJ: INTRAMUSCULAR | 1 refills | Status: DC

## 2021-01-25 NOTE — Progress Notes (Signed)
I,[name],acting as a Education administrator for [name], MD.,have documented all relevant documentation on the behalf of [name], MD,as directed by  [name], MD while in the presence of [name], MD.  This visit occurred during the SARS-CoV-2 public health emergency.  Safety protocols were in place, including screening questions prior to the visit, additional usage of staff PPE, and extensive cleaning of exam room while observing appropriate contact time as indicated for disinfecting solutions.  Subjective:     Patient ID: Hannah Knapp , female    DOB: 08-14-1980 , 40 y.o.   MRN: [id number]   Chief Complaint  Patient presents with  . Arm Pain    Left arm    HPI  The patient is here today for evaluation of left arm discomfort and numbness in her left leg. She reports she feels like her veins are "jumping". She thinks she is having circulation issues. She states her sx started 3-4 weeks ago. She admits that she is not exercising regularly. She denies fall/trauma. Not sure what could be triggering her sx. Denies UE/LE weakness.  Arm Pain  The incident occurred more than 1 week ago. There was no injury mechanism. The quality of the pain is described as burning. Associated symptoms include numbness (left leg).    Past Medical History:  Diagnosis Date  . Fibroid   . Hypothyroidism   . Thyroid nodule      Family History  Problem Relation Age of Onset  . Hypertension Maternal Grandmother   . Migraines Maternal Grandmother   . Fibromyalgia Maternal Grandmother   . Depression Maternal Grandmother   . Heart disease Maternal Grandfather      Current Outpatient Medications:  .  albuterol (VENTOLIN HFA) 108 (90 Base) MCG/ACT inhaler, Inhale 2 puffs into the lungs every 6 (six) hours as needed for wheezing or shortness of breath., Disp: 8 g, Rfl: 2 .  levocetirizine (XYZAL) 5 MG tablet, TAKE 1 TABLET BY MOUTH EVERY DAY IN THE EVENING, Disp: 90 tablet, Rfl: 1 .   Vitamin D, Ergocalciferol, (DRISDOL) 1.25 MG (50000 UNIT) CAPS capsule, One capsule po twice weekly on Tues/Fridays, Disp: 24 capsule, Rfl: 0 .  EPINEPHrine (EPIPEN 2-PAK) 0.3 mg/0.3 mL IJ SOAJ injection, EpiPen 2-Pak 0.3 mg/0.3 mL injection, auto-injector, Disp: 2 each, Rfl: 1 .  ibuprofen (ADVIL,MOTRIN) 600 MG tablet, Take 1 tablet (600 mg total) by mouth every 8 (eight) hours as needed for moderate pain. (Patient not taking: Reported on 01/25/2021), Disp: 30 tablet, Rfl: 0   Allergies  Allergen Reactions  . Azithromycin Other (See Comments)    Makes patient very sick  . Other Hives and Itching    Shellfish and allergic to molds and pet dander     Review of Systems  Constitutional: Negative.   Respiratory: Negative.    Cardiovascular: Negative.   Gastrointestinal: Negative.   Neurological:  Positive for numbness (left leg).  Psychiatric/Behavioral: Negative.    All other systems reviewed and are negative.   Today's Vitals   01/25/21 1132  BP: 114/80  Pulse: 77  Temp: 98.4 F (36.9 C)  TempSrc: Oral  Weight: 178 lb 9.6 oz (81 kg)  Height: 5' 2.4" (1.585 m)  PainSc: 0-No pain   Body mass index is 32.25 kg/m.  Wt Readings from Last 3 Encounters:  01/25/21 178 lb 9.6 oz (81 kg)  08/02/20 171 lb 12.8 oz (77.9 kg)  05/10/20 172 lb 3.2 oz (78.1 kg)    BP  Readings from Last 3 Encounters:  01/25/21 114/80  05/10/20 112/60  01/13/20 116/74    Objective:  Physical Exam Vitals and nursing note reviewed.  Constitutional:      Appearance: Normal appearance.  HENT:     Head: Normocephalic and atraumatic.     Nose:     Comments: Masked     Mouth/Throat:     Comments: Masked  Cardiovascular:     Rate and Rhythm: Normal rate and regular rhythm.     Heart sounds: Normal heart sounds.     Comments: Spider veins b/l LE Pulmonary:     Effort: Pulmonary effort is normal.     Breath sounds: Normal breath sounds.  Musculoskeletal:     Cervical back: Normal range of motion.      Right lower leg: No edema.     Left lower leg: No edema.     Comments: Neg Tinel's/Phalen's signs  Skin:    General: Skin is warm.  Neurological:     General: No focal deficit present.     Mental Status: She is alert.  Psychiatric:        Mood and Affect: Mood normal.        Behavior: Behavior normal.        Assessment And Plan:     1. Paresthesias Comments: I will check labs as listed below. I will consider referral for NCS if her sx persist.  - Vitamin B12  2. Fasciculations Comments: She reports having normal TSH as per Dr. Chalmers Cater. She is encouraged to stay well hydrated, consider Mg supplementation.  - Magnesium - BMP8+eGFR  3. Spider veins Comments: I will refer to Vascular if she develops pain.   4. Pain of left calf Comments: I will check D-dimer. She agrees to move forward with u/s if positive.  - D-dimer, quantitative (not at [facility])    Patient was given opportunity to ask questions. Patient verbalized understanding of the plan and was able to repeat key elements of the plan. All questions were answered to their satisfaction.   I, [name], MD, have reviewed all documentation for this visit. The documentation on 02/17/21 for the exam, diagnosis, procedures, and orders are all accurate and complete.   IF YOU HAVE BEEN REFERRED TO A SPECIALIST, IT MAY TAKE 1-2 WEEKS TO SCHEDULE/PROCESS THE REFERRAL. IF YOU HAVE NOT HEARD FROM US/SPECIALIST IN TWO WEEKS, PLEASE GIVE [place] A CALL AT [phone] X [phone].   THE PATIENT IS ENCOURAGED TO PRACTICE SOCIAL DISTANCING DUE TO THE COVID-19 PANDEMIC.

## 2021-01-26 LAB — BMP8+EGFR
BUN/Creatinine Ratio: 14 (ref 9–23)
BUN: 11 mg/dL (ref 6–24)
CO2: 22 mmol/L (ref 20–29)
Calcium: 9.2 mg/dL (ref 8.7–10.2)
Chloride: 103 mmol/L (ref 96–106)
Creatinine, Ser: 0.77 mg/dL (ref 0.57–1.00)
Glucose: 74 mg/dL (ref 65–99)
Potassium: 4.4 mmol/L (ref 3.5–5.2)
Sodium: 141 mmol/L (ref 134–144)
eGFR: 100 mL/min/{1.73_m2} (ref >59)

## 2021-01-26 LAB — MAGNESIUM: Magnesium: 2.1 mg/dL (ref 1.6–2.3)

## 2021-01-26 LAB — VITAMIN B12: Vitamin B-12: 403 pg/mL (ref 232–1245)

## 2021-01-26 LAB — D-DIMER, QUANTITATIVE: D-DIMER: 0.28 mg/L FEU (ref 0.00–0.49)

## 2021-05-13 ENCOUNTER — Other Ambulatory Visit: Payer: Self-pay | Admitting: Internal Medicine

## 2021-05-22 ENCOUNTER — Encounter: Payer: BC Managed Care – PPO | Admitting: Internal Medicine

## 2021-05-23 ENCOUNTER — Encounter: Payer: Self-pay | Admitting: Internal Medicine

## 2021-05-23 ENCOUNTER — Other Ambulatory Visit: Payer: Self-pay

## 2021-05-23 ENCOUNTER — Ambulatory Visit (INDEPENDENT_AMBULATORY_CARE_PROVIDER_SITE_OTHER): Payer: BC Managed Care – PPO | Admitting: Internal Medicine

## 2021-05-23 VITALS — BP 110/76 | HR 82 | Temp 98.1°F | Ht 62.0 in | Wt 179.8 lb

## 2021-05-23 DIAGNOSIS — Z23 Encounter for immunization: Secondary | ICD-10-CM | POA: Diagnosis not present

## 2021-05-23 DIAGNOSIS — Z6832 Body mass index (BMI) 32.0-32.9, adult: Secondary | ICD-10-CM | POA: Diagnosis not present

## 2021-05-23 DIAGNOSIS — E049 Nontoxic goiter, unspecified: Secondary | ICD-10-CM

## 2021-05-23 DIAGNOSIS — E6609 Other obesity due to excess calories: Secondary | ICD-10-CM

## 2021-05-23 DIAGNOSIS — Z0000 Encounter for general adult medical examination without abnormal findings: Secondary | ICD-10-CM

## 2021-05-23 NOTE — Progress Notes (Signed)
I,Victoria T Hamilton,acting as a scribe for [name], MD.,have documented all relevant documentation on the behalf of [name], MD,as directed by  [name], MD while in the presence of [name], MD.  This visit occurred during the SARS-CoV-2 public health emergency.  Safety protocols were in place, including screening questions prior to the visit, additional usage of staff PPE, and extensive cleaning of exam room while observing appropriate contact time as indicated for disinfecting solutions.  Subjective:     Patient ID: Hannah Knapp , female    DOB: [birth year] , 40 y.o.   MRN: [id number]   Chief Complaint  Patient presents with   Annual Exam    HPI  The patient is here for HM. The patient is followed by Dr. Crawford Givens for GYN care.  Her pap appt is scheduled for November 17th.    Past Medical History:  Diagnosis Date   Fibroid    Hypothyroidism    Thyroid nodule      Family History  Problem Relation Age of Onset   Hypertension Maternal Grandmother    Migraines Maternal Grandmother    Fibromyalgia Maternal Grandmother    Depression Maternal Grandmother    Heart disease Maternal Grandfather      Current Outpatient Medications:    albuterol (VENTOLIN HFA) 108 (90 Base) MCG/ACT inhaler, Inhale 2 puffs into the lungs every 6 (six) hours as needed for wheezing or shortness of breath., Disp: 8 g, Rfl: 2   EPINEPHrine (EPIPEN 2-PAK) 0.3 mg/0.3 mL IJ SOAJ injection, EpiPen 2-Pak 0.3 mg/0.3 mL injection, auto-injector, Disp: 2 each, Rfl: 1   ibuprofen (ADVIL,MOTRIN) 600 MG tablet, Take 1 tablet (600 mg total) by mouth every 8 (eight) hours as needed for moderate pain., Disp: 30 tablet, Rfl: 0   levocetirizine (XYZAL) 5 MG tablet, TAKE 1 TABLET BY MOUTH EVERY DAY IN THE EVENING, Disp: 90 tablet, Rfl: 1   VITAMIN D PO, Take by mouth., Disp: , Rfl:    Vitamin D, Ergocalciferol, (DRISDOL) 1.25 MG (50000 UNIT) CAPS capsule, One capsule po twice weekly on  Tues/Fridays (Patient not taking: Reported on 05/23/2021), Disp: 24 capsule, Rfl: 0   Allergies  Allergen Reactions   Azithromycin Other (See Comments)    Makes patient very sick   Other Hives and Itching    Shellfish and allergic to molds and pet dander     Review of Systems  Constitutional: Negative.   HENT: Negative.    Eyes: Negative.   Respiratory: Negative.    Cardiovascular: Negative.   Gastrointestinal: Negative.   Endocrine: Negative.   Genitourinary: Negative.   Musculoskeletal: Negative.   Skin: Negative.   Allergic/Immunologic: Negative.   Neurological: Negative.   Hematological: Negative.   Psychiatric/Behavioral: Negative.      Today's Vitals   05/23/21 0859  BP: 110/76  Pulse: 82  Temp: 98.1 F (36.7 C)  Weight: 179 lb 12.8 oz (81.6 kg)  Height: 5' 2" (1.575 m)   Body mass index is 32.89 kg/m.  Wt Readings from Last 3 Encounters:  05/23/21 179 lb 12.8 oz (81.6 kg)  01/25/21 178 lb 9.6 oz (81 kg)  08/02/20 171 lb 12.8 oz (77.9 kg)    BP Readings from Last 3 Encounters:  05/23/21 110/76  01/25/21 114/80  05/10/20 112/60    Objective:  Physical Exam Vitals and nursing note reviewed.  Constitutional:      Appearance: Normal appearance.  HENT:     Head: Normocephalic and atraumatic.  Right Ear: Tympanic membrane, ear canal and external ear normal.     Left Ear: Tympanic membrane, ear canal and external ear normal.     Nose: Nose normal.     Mouth/Throat:     Mouth: Mucous membranes are moist.     Pharynx: Oropharynx is clear.  Eyes:     Extraocular Movements: Extraocular movements intact.     Conjunctiva/sclera: Conjunctivae normal.     Pupils: Pupils are equal, round, and reactive to light.  Neck:     Thyroid: Thyromegaly present.  Cardiovascular:     Rate and Rhythm: Normal rate and regular rhythm.     Pulses: Normal pulses.     Heart sounds: Normal heart sounds.  Pulmonary:     Effort: Pulmonary effort is normal.     Breath  sounds: Normal breath sounds.  Abdominal:     General: Abdomen is flat. Bowel sounds are normal.     Palpations: Abdomen is soft.  Genitourinary:    Comments: deferred Musculoskeletal:        General: Normal range of motion.     Cervical back: Normal range of motion and neck supple.  Skin:    General: Skin is warm and dry.  Neurological:     General: No focal deficit present.     Mental Status: She is alert and oriented to person, place, and time.  Psychiatric:        Mood and Affect: Mood normal.        Behavior: Behavior normal.        Assessment And Plan:     1. Encounter for general adult medical examination w/o abnormal findings Comments: A full exam was performed. Importance of monthly self breast exams was discussed with the patient. She is scheduled for pap on 11/17. PATIENT IS ADVISED TO GET 30-45 MINUTES REGULAR EXERCISE NO LESS THAN FOUR TO FIVE DAYS PER WEEK - BOTH WEIGHTBEARING EXERCISES AND AEROBIC ARE RECOMMENDED.  PATIENT IS ADVISED TO FOLLOW A HEALTHY DIET WITH AT LEAST SIX FRUITS/VEGGIES PER DAY, DECREASE INTAKE OF RED MEAT, AND TO INCREASE FISH INTAKE TO TWO DAYS PER WEEK.  MEATS/FISH SHOULD NOT BE FRIED, BAKED OR BROILED IS PREFERABLE.  IT IS ALSO IMPORTANT TO CUT BACK ON YOUR SUGAR INTAKE. PLEASE AVOID ANYTHING WITH ADDED SUGAR, CORN SYRUP OR OTHER SWEETENERS. IF YOU MUST USE A SWEETENER, YOU CAN TRY STEVIA. IT IS ALSO IMPORTANT TO AVOID ARTIFICIALLY SWEETENERS AND DIET BEVERAGES. LASTLY, I SUGGEST WEARING SPF 50 SUNSCREEN ON EXPOSED PARTS AND ESPECIALLY WHEN IN THE DIRECT SUNLIGHT FOR AN EXTENDED PERIOD OF TIME.  PLEASE AVOID FAST FOOD RESTAURANTS AND INCREASE YOUR WATER INTAKE. - CMP14+EGFR - CBC - Lipid panel - Insulin, random(561)  2. Goiter Comments: She is also followed by Dr. Chalmers Cater. I will request records/labs. She does not wish to have this removed at this time.   3. Class 1 obesity due to excess calories without serious comorbidity with body mass index  (BMI) of 32.0 to 32.9 in adult She is encouraged to incorporate at least 150 minutes of exercise per week. I will also refer her to [facility] clinic for further counseling. She is in agreement with her treatment plan.   4. Need for vaccination Comments: She was given flu vaccine.    Patient was given opportunity to ask questions. Patient verbalized understanding of the plan and was able to repeat key elements of the plan. All questions were answered to their satisfaction.   I, [name], MD,  have reviewed all documentation for this visit. The documentation on 05/23/21 for the exam, diagnosis, procedures, and orders are all accurate and complete.   IF YOU HAVE BEEN REFERRED TO A SPECIALIST, IT MAY TAKE 1-2 WEEKS TO SCHEDULE/PROCESS THE REFERRAL. IF YOU HAVE NOT HEARD FROM US/SPECIALIST IN TWO WEEKS, PLEASE GIVE [place] A CALL AT [phone].   THE PATIENT IS ENCOURAGED TO PRACTICE SOCIAL DISTANCING DUE TO THE COVID-19 PANDEMIC.

## 2021-05-23 NOTE — Patient Instructions (Addendum)
Intermittent fasting for women E2M fitness   Health Maintenance, Female Adopting a healthy lifestyle and getting preventive care are important in promoting health and wellness. Ask your health care provider about: The right schedule for you to have regular tests and exams. Things you can do on your own to prevent diseases and keep yourself healthy. What should I know about diet, weight, and exercise? Eat a healthy diet  Eat a diet that includes plenty of vegetables, fruits, low-fat dairy products, and lean protein. Do not eat a lot of foods that are high in solid fats, added sugars, or sodium. Maintain a healthy weight Body mass index (BMI) is used to identify weight problems. It estimates body fat based on height and weight. Your health care provider can help determine your BMI and help you achieve or maintain a healthy weight. Get regular exercise Get regular exercise. This is one of the most important things you can do for your health. Most adults should: Exercise for at least 150 minutes each week. The exercise should increase your heart rate and make you sweat (moderate-intensity exercise). Do strengthening exercises at least twice a week. This is in addition to the moderate-intensity exercise. Spend less time sitting. Even light physical activity can be beneficial. Watch cholesterol and blood lipids Have your blood tested for lipids and cholesterol at 40 years of age, then have this test every 5 years. Have your cholesterol levels checked more often if: Your lipid or cholesterol levels are high. You are older than 40 years of age. You are at high risk for heart disease. What should I know about cancer screening? Depending on your health history and family history, you may need to have cancer screening at various ages. This may include screening for: Breast cancer. Cervical cancer. Colorectal cancer. Skin cancer. Lung cancer. What should I know about heart disease, diabetes, and  high blood pressure? Blood pressure and heart disease High blood pressure causes heart disease and increases the risk of stroke. This is more likely to develop in people who have high blood pressure readings, are of African descent, or are overweight. Have your blood pressure checked: Every 3-5 years if you are 39-84 years of age. Every year if you are 38 years old or older. Diabetes Have regular diabetes screenings. This checks your fasting blood sugar level. Have the screening done: Once every three years after age 37 if you are at a normal weight and have a low risk for diabetes. More often and at a younger age if you are overweight or have a high risk for diabetes. What should I know about preventing infection? Hepatitis B If you have a higher risk for hepatitis B, you should be screened for this virus. Talk with your health care provider to find out if you are at risk for hepatitis B infection. Hepatitis C Testing is recommended for: Everyone born from [birth year] through 1965. Anyone with known risk factors for hepatitis C. Sexually transmitted infections (STIs) Get screened for STIs, including gonorrhea and chlamydia, if: You are sexually active and are younger than 40 years of age. You are older than 39 years of age and your health care provider tells you that you are at risk for this type of infection. Your sexual activity has changed since you were last screened, and you are at increased risk for chlamydia or gonorrhea. Ask your health care provider if you are at risk. Ask your health care provider about whether you are at high risk for HIV. Your  health care provider may recommend a prescription medicine to help prevent HIV infection. If you choose to take medicine to prevent HIV, you should first get tested for HIV. You should then be tested every 3 months for as long as you are taking the medicine. Pregnancy If you are about to stop having your period (premenopausal) and you may become  pregnant, seek counseling before you get pregnant. Take 400 to 800 micrograms (mcg) of folic acid every day if you become pregnant. Ask for birth control (contraception) if you want to prevent pregnancy. Osteoporosis and menopause Osteoporosis is a disease in which the bones lose minerals and strength with aging. This can result in bone fractures. If you are 40 years old or older, or if you are at risk for osteoporosis and fractures, ask your health care provider if you should: Be screened for bone loss. Take a calcium or vitamin D supplement to lower your risk of fractures. Be given hormone replacement therapy (HRT) to treat symptoms of menopause. Follow these instructions at home: Lifestyle Do not use any products that contain nicotine or tobacco, such as cigarettes, e-cigarettes, and chewing tobacco. If you need help quitting, ask your health care provider. Do not use street drugs. Do not share needles. Ask your health care provider for help if you need support or information about quitting drugs. Alcohol use Do not drink alcohol if: Your health care provider tells you not to drink. You are pregnant, may be pregnant, or are planning to become pregnant. If you drink alcohol: Limit how much you use to 0-1 drink a day. Limit intake if you are breastfeeding. Be aware of how much alcohol is in your drink. In the [place] one drink equals one 12 oz bottle of beer (355 mL), one 5 oz glass of wine (148 mL), or one 1 oz glass of hard liquor (44 mL). General instructions Schedule regular health, dental, and eye exams. Stay current with your vaccines. Tell your health care provider if: You often feel depressed. You have ever been abused or do not feel safe at home. Summary Adopting a healthy lifestyle and getting preventive care are important in promoting health and wellness. Follow your health care provider's instructions about healthy diet, exercising, and getting tested or screened for  diseases. Follow your health care provider's instructions on monitoring your cholesterol and blood pressure. This information is not intended to replace advice given to you by your health care provider. Make sure you discuss any questions you have with your health care provider. Document Revised: 09/15/2020 Document Reviewed: 07/01/2018 Elsevier Patient Education  2022 [employer].

## 2021-05-24 LAB — CMP14+EGFR
ALT: 9 IU/L (ref 0–32)
AST: 12 IU/L (ref 0–40)
Albumin/Globulin Ratio: 1.6 (ref 1.2–2.2)
Albumin: 4.6 g/dL (ref 3.8–4.8)
Alkaline Phosphatase: 68 IU/L (ref 44–121)
BUN/Creatinine Ratio: 11 (ref 9–23)
BUN: 9 mg/dL (ref 6–24)
Bilirubin Total: 0.3 mg/dL (ref 0.0–1.2)
CO2: 24 mmol/L (ref 20–29)
Calcium: 9.4 mg/dL (ref 8.7–10.2)
Chloride: 100 mmol/L (ref 96–106)
Creatinine, Ser: 0.84 mg/dL (ref 0.57–1.00)
Globulin, Total: 2.9 g/dL (ref 1.5–4.5)
Glucose: 73 mg/dL (ref 70–99)
Potassium: 4.3 mmol/L (ref 3.5–5.2)
Sodium: 141 mmol/L (ref 134–144)
Total Protein: 7.5 g/dL (ref 6.0–8.5)
eGFR: 90 mL/min/{1.73_m2} (ref >59)

## 2021-05-24 LAB — CBC
Hematocrit: 38.1 % (ref 34.0–46.6)
Hemoglobin: 12.7 g/dL (ref 11.1–15.9)
MCH: 29.1 pg (ref 26.6–33.0)
MCHC: 33.3 g/dL (ref 31.5–35.7)
MCV: 87 fL (ref 79–97)
Platelets: 279 10*3/uL (ref 150–450)
RBC: 4.37 x10E6/uL (ref 3.77–5.28)
RDW: 11.8 % (ref 11.7–15.4)
WBC: 5.7 10*3/uL (ref 3.4–10.8)

## 2021-05-24 LAB — INSULIN, RANDOM: INSULIN: 7.7 u[IU]/mL (ref 2.6–24.9)

## 2021-05-24 LAB — LIPID PANEL
Chol/HDL Ratio: 3.2 ratio (ref 0.0–4.4)
Cholesterol, Total: 181 mg/dL (ref 100–199)
HDL: 56 mg/dL (ref >39)
LDL Chol Calc (NIH): 113 mg/dL — ABNORMAL HIGH (ref 0–99)
Triglycerides: 66 mg/dL (ref 0–149)
VLDL Cholesterol Cal: 12 mg/dL (ref 5–40)

## 2021-06-23 LAB — HM PAP SMEAR

## 2021-06-25 ENCOUNTER — Encounter: Payer: Self-pay | Admitting: Internal Medicine

## 2021-08-13 ENCOUNTER — Other Ambulatory Visit: Payer: Self-pay | Admitting: Obstetrics and Gynecology

## 2021-08-13 DIAGNOSIS — R928 Other abnormal and inconclusive findings on diagnostic imaging of breast: Secondary | ICD-10-CM

## 2021-08-17 ENCOUNTER — Encounter: Payer: Self-pay | Admitting: Internal Medicine

## 2021-08-18 ENCOUNTER — Other Ambulatory Visit: Payer: Self-pay

## 2021-08-18 MED ORDER — AZELASTINE HCL 0.1 % NA SOLN: NASAL | 1 refills | Status: DC

## 2021-09-05 ENCOUNTER — Ambulatory Visit
Admission: RE | Admit: 2021-09-05 | Discharge: 2021-09-05 | Disposition: A | Discharge status: Home / Self Care | Payer: BC Managed Care – PPO | Source: Ambulatory Visit | Attending: Obstetrics and Gynecology | Admitting: Obstetrics and Gynecology

## 2021-09-05 ENCOUNTER — Ambulatory Visit: Payer: BC Managed Care – PPO

## 2021-09-05 DIAGNOSIS — R928 Other abnormal and inconclusive findings on diagnostic imaging of breast: Secondary | ICD-10-CM

## 2021-09-11 ENCOUNTER — Other Ambulatory Visit: Payer: BC Managed Care – PPO

## 2021-11-19 ENCOUNTER — Other Ambulatory Visit: Payer: Self-pay | Admitting: Internal Medicine

## 2021-12-25 ENCOUNTER — Other Ambulatory Visit: Payer: Self-pay | Admitting: Endocrinology

## 2021-12-25 DIAGNOSIS — E041 Nontoxic single thyroid nodule: Secondary | ICD-10-CM

## 2022-01-23 ENCOUNTER — Ambulatory Visit
Admission: RE | Admit: 2022-01-23 | Discharge: 2022-01-23 | Disposition: A | Discharge status: Home / Self Care | Payer: BC Managed Care – PPO | Source: Ambulatory Visit | Attending: Endocrinology | Admitting: Endocrinology

## 2022-01-23 DIAGNOSIS — E041 Nontoxic single thyroid nodule: Secondary | ICD-10-CM

## 2022-04-30 ENCOUNTER — Encounter: Payer: Self-pay | Admitting: Internal Medicine

## 2022-04-30 ENCOUNTER — Ambulatory Visit (INDEPENDENT_AMBULATORY_CARE_PROVIDER_SITE_OTHER): Payer: BC Managed Care – PPO | Admitting: Internal Medicine

## 2022-04-30 VITALS — BP 118/82 | HR 94 | Temp 98.6°F | Ht 62.0 in | Wt 184.8 lb

## 2022-04-30 DIAGNOSIS — H6690 Otitis media, unspecified, unspecified ear: Secondary | ICD-10-CM | POA: Diagnosis not present

## 2022-04-30 DIAGNOSIS — R0982 Postnasal drip: Secondary | ICD-10-CM

## 2022-04-30 DIAGNOSIS — J309 Allergic rhinitis, unspecified: Secondary | ICD-10-CM

## 2022-04-30 DIAGNOSIS — E6609 Other obesity due to excess calories: Secondary | ICD-10-CM | POA: Diagnosis not present

## 2022-04-30 DIAGNOSIS — J329 Chronic sinusitis, unspecified: Secondary | ICD-10-CM

## 2022-04-30 DIAGNOSIS — Z6833 Body mass index (BMI) 33.0-33.9, adult: Secondary | ICD-10-CM

## 2022-04-30 NOTE — Progress Notes (Signed)
[name],acting as a Education administrator for [name], MD.,have documented all relevant documentation on the behalf of [name], MD,as directed by  [name], MD while in the presence of [name], MD.    Subjective:     Patient ID: Hannah Knapp , female    DOB: 16-Apr-1981 , 41 y.o.   MRN: [id number]   Chief Complaint  Patient presents with  . Ear Pain    HPI  Patient presents today for a ear infection. She states she was treated for a sinus infection at [facility] on 9/22 with amoxicillin. She went back on 10/1 because she started to have ear pain. She was diagnosed with an ear infection and prescribed Augmentin. She reported when she lays down she feels like she can hear dripping inside her ear. She was using earache drops otc which have provided her the most relief. She reports hearing ringing noise in both her ears.  She is frustrated because her sx have not improved.   Otalgia  There is pain in both ears. This is a new problem. The current episode started in the past 7 days. The problem has been gradually improving. There has been no fever. The pain is at a severity of 0/10. Pertinent negatives include no ear discharge. She has tried antibiotics for the symptoms. The treatment provided significant relief.     Past Medical History:  Diagnosis Date  . Fibroid   . Hypothyroidism   . Thyroid nodule      Family History  Problem Relation Age of Onset  . Hypertension Maternal Grandmother   . Migraines Maternal Grandmother   . Fibromyalgia Maternal Grandmother   . Depression Maternal Grandmother   . Heart disease Maternal Grandfather      Current Outpatient Medications:  .  albuterol (VENTOLIN HFA) 108 (90 Base) MCG/ACT inhaler, Inhale 2 puffs into the lungs every 6 (six) hours as needed for wheezing or shortness of breath., Disp: 8 g, Rfl: 2 .  amoxicillin-clavulanate (AUGMENTIN) 875-125 MG tablet, SMARTSIG:1 Tablet(s) By Mouth Every 12  Hours, Disp: , Rfl:  .  azelastine (ASTELIN) 0.1 % nasal spray, azelastine 137 mcg (0.1 %) nasal spray aerosol, Disp: 30 mL, Rfl: 1 .  EPINEPHrine (EPIPEN 2-PAK) 0.3 mg/0.3 mL IJ SOAJ injection, EpiPen 2-Pak 0.3 mg/0.3 mL injection, auto-injector, Disp: 2 each, Rfl: 1 .  ibuprofen (ADVIL,MOTRIN) 600 MG tablet, Take 1 tablet (600 mg total) by mouth every 8 (eight) hours as needed for moderate pain., Disp: 30 tablet, Rfl: 0 .  levocetirizine (XYZAL) 5 MG tablet, TAKE 1 TABLET BY MOUTH EVERY DAY IN THE EVENING, Disp: 90 tablet, Rfl: 1 .  VITAMIN D PO, Take by mouth., Disp: , Rfl:    Allergies  Allergen Reactions  . Azithromycin Other (See Comments)    Makes patient very sick  . Other Hives and Itching    Shellfish and allergic to molds and pet dander     Review of Systems  HENT:  Positive for ear pain. Negative for ear discharge.      Today's Vitals   04/30/22 1540  BP: 118/82  Pulse: 94  Temp: 98.6 F (37 C)  Weight: 184 lb 12.8 oz (83.8 kg)  Height: '5\' 2"'$  (1.575 m)  PainSc: 0-No pain   Body mass index is 33.8 kg/m.  Wt Readings from Last 3 Encounters:  04/30/22 184 lb 12.8 oz (83.8 kg)  05/23/21 179 lb 12.8 oz (81.6 kg)  01/25/21  178 lb 9.6 oz (81 kg)     Objective:  Physical Exam Vitals and nursing note reviewed.  Constitutional:      Appearance: Normal appearance.  HENT:     Head: Normocephalic and atraumatic.     Nose:     Comments: Masked     Mouth/Throat:     Comments: Masked  Eyes:     Extraocular Movements: Extraocular movements intact.  Cardiovascular:     Rate and Rhythm: Normal rate and regular rhythm.     Heart sounds: Normal heart sounds.  Pulmonary:     Effort: Pulmonary effort is normal.     Breath sounds: Normal breath sounds.  Skin:    General: Skin is warm.  Neurological:     General: No focal deficit present.     Mental Status: She is alert.  Psychiatric:        Mood and Affect: Mood normal.        Behavior: Behavior normal.      Assessment And Plan:     1. Subacute otitis media, unspecified otitis media type  2. Class 1 obesity due to excess calories without serious comorbidity with body mass index (BMI) of 33.0 to 33.9 in adult    Patient was given opportunity to ask questions. Patient verbalized understanding of the plan and was able to repeat key elements of the plan. All questions were answered to their satisfaction.   I, [name], MD, have reviewed all documentation for this visit. The documentation on 04/30/22 for the exam, diagnosis, procedures, and orders are all accurate and complete.   IF YOU HAVE BEEN REFERRED TO A SPECIALIST, IT MAY TAKE 1-2 WEEKS TO SCHEDULE/PROCESS THE REFERRAL. IF YOU HAVE NOT HEARD FROM US/SPECIALIST IN TWO WEEKS, PLEASE GIVE [place] A CALL AT [phone].   THE PATIENT IS ENCOURAGED TO PRACTICE SOCIAL DISTANCING DUE TO THE COVID-19 PANDEMIC.

## 2022-06-10 ENCOUNTER — Encounter: Payer: Self-pay | Admitting: Internal Medicine

## 2022-06-10 ENCOUNTER — Ambulatory Visit (INDEPENDENT_AMBULATORY_CARE_PROVIDER_SITE_OTHER): Payer: BC Managed Care – PPO | Admitting: Internal Medicine

## 2022-06-10 ENCOUNTER — Other Ambulatory Visit: Payer: Self-pay

## 2022-06-10 VITALS — BP 118/70 | HR 87 | Temp 98.9°F | Ht 62.0 in | Wt 186.2 lb

## 2022-06-10 DIAGNOSIS — E6609 Other obesity due to excess calories: Secondary | ICD-10-CM

## 2022-06-10 DIAGNOSIS — H9313 Tinnitus, bilateral: Secondary | ICD-10-CM | POA: Diagnosis not present

## 2022-06-10 DIAGNOSIS — Z0000 Encounter for general adult medical examination without abnormal findings: Secondary | ICD-10-CM | POA: Diagnosis not present

## 2022-06-10 DIAGNOSIS — E041 Nontoxic single thyroid nodule: Secondary | ICD-10-CM

## 2022-06-10 DIAGNOSIS — Z2821 Immunization not carried out because of patient refusal: Secondary | ICD-10-CM

## 2022-06-10 DIAGNOSIS — Z6833 Body mass index (BMI) 33.0-33.9, adult: Secondary | ICD-10-CM | POA: Insufficient documentation

## 2022-06-10 DIAGNOSIS — U071 COVID-19: Secondary | ICD-10-CM

## 2022-06-10 DIAGNOSIS — R059 Cough, unspecified: Secondary | ICD-10-CM

## 2022-06-10 MED ORDER — AZELASTINE HCL 0.1 % NA SOLN: NASAL | 1 refills | Status: DC

## 2022-06-10 MED ORDER — EPINEPHRINE 0.3 MG/0.3ML IJ SOAJ: INTRAMUSCULAR | 1 refills | Status: AC

## 2022-06-10 MED ORDER — ALBUTEROL SULFATE HFA 108 (90 BASE) MCG/ACT IN AERS: 2.0000 | INHALATION_SPRAY | Freq: Four times a day (QID) | RESPIRATORY_TRACT | 2 refills | Status: AC | PRN

## 2022-06-10 MED ORDER — LEVOCETIRIZINE DIHYDROCHLORIDE 5 MG PO TABS: ORAL_TABLET | ORAL | 1 refills | Status: DC

## 2022-06-10 NOTE — Progress Notes (Signed)
Barnet Glasgow Martin,acting as a Education administrator for [name], MD.,have documented all relevant documentation on the behalf of [name], MD,as directed by  [name], MD while in the presence of [name], MD.   Subjective:     Patient ID: Hannah Knapp , female    DOB: [birth year] , 41 y.o.   MRN: [id number]   Chief Complaint  Patient presents with   Annual Exam    HPI  The patient is here for HM. The patient is followed by Dr. Crawford Givens for GYN care.  She was last seen in 2022.  Patient has no other concerns today.  BP Readings from Last 3 Encounters: 06/10/22 : 118/70 04/30/22 : 118/82 05/23/21 : 110/76       Past Medical History:  Diagnosis Date   Fibroid    Hypothyroidism    Thyroid nodule      Family History  Problem Relation Age of Onset   Hypertension Maternal Grandmother    Migraines Maternal Grandmother    Fibromyalgia Maternal Grandmother    Depression Maternal Grandmother    Heart disease Maternal Grandfather      Current Outpatient Medications:    ibuprofen (ADVIL,MOTRIN) 600 MG tablet, Take 1 tablet (600 mg total) by mouth every 8 (eight) hours as needed for moderate pain., Disp: 30 tablet, Rfl: 0   VITAMIN D PO, Take by mouth., Disp: , Rfl:    albuterol (VENTOLIN HFA) 108 (90 Base) MCG/ACT inhaler, Inhale 2 puffs into the lungs every 6 (six) hours as needed for wheezing or shortness of breath., Disp: 8 g, Rfl: 2   amoxicillin-clavulanate (AUGMENTIN) 875-125 MG tablet, SMARTSIG:1 Tablet(s) By Mouth Every 12 Hours (Patient not taking: Reported on 06/10/2022), Disp: , Rfl:    azelastine (ASTELIN) 0.1 % nasal spray, azelastine 137 mcg (0.1 %) nasal spray aerosol, Disp: 30 mL, Rfl: 1   EPINEPHrine (EPIPEN 2-PAK) 0.3 mg/0.3 mL IJ SOAJ injection, EpiPen 2-Pak 0.3 mg/0.3 mL injection, auto-injector, Disp: 2 each, Rfl: 1   levocetirizine (XYZAL) 5 MG tablet, TAKE 1 TABLET BY MOUTH EVERY DAY IN THE EVENING, Disp: 90 tablet, Rfl: 1   Allergies   Allergen Reactions   Azithromycin Other (See Comments)    Makes patient very sick   Other Hives and Itching    Shellfish and allergic to molds and pet dander    The patient states she uses none for birth control. Last LMP was Patient's last menstrual period was 06/02/2022..    Negative for: breast discharge, breast lump(s), breast pain and breast self exam. Associated symptoms include abnormal vaginal bleeding. Pertinent negatives include abnormal bleeding (hematology), anxiety, decreased libido, depression, difficulty falling sleep, dyspareunia, history of infertility, nocturia, sexual dysfunction, sleep disturbances, urinary incontinence, urinary urgency, vaginal discharge and vaginal itching. Diet regular.The patient states her exercise level is  intermittent.  . The patient's tobacco use is:  Social History   Tobacco Use  Smoking Status Never  Smokeless Tobacco Never  . She has been exposed to passive smoke. The patient's alcohol use is:  Social History   Substance and Sexual Activity  Alcohol Use No   Review of Systems  Constitutional: Negative.   HENT:  Positive for tinnitus.        Unfortunately, she has yet to hear about ENT referral. She is still experiencing tinnitus. She has sx in both ears. Usually occurs at night.   Eyes: Negative.   Respiratory: Negative.    Cardiovascular: Negative.   Gastrointestinal:  Negative.   Endocrine: Negative.   Genitourinary: Negative.   Musculoskeletal: Negative.   Skin: Negative.   Allergic/Immunologic: Negative.   Neurological: Negative.   Hematological: Negative.   Psychiatric/Behavioral: Negative.       Today's Vitals   06/10/22 0911  BP: 118/70  Pulse: 87  Temp: 98.9 F (37.2 C)  TempSrc: Oral  Weight: 186 lb 3.2 oz (84.5 kg)  Height: '5\' 2"'$  (1.575 m)  PainSc: 0-No pain   Body mass index is 34.06 kg/m.  Wt Readings from Last 3 Encounters:  06/10/22 186 lb 3.2 oz (84.5 kg)  04/30/22 184 lb 12.8 oz (83.8 kg)  05/23/21  179 lb 12.8 oz (81.6 kg)    Objective:  Physical Exam Vitals and nursing note reviewed.  Constitutional:      Appearance: Normal appearance.  HENT:     Head: Normocephalic and atraumatic.     Right Ear: Tympanic membrane, ear canal and external ear normal.     Left Ear: Tympanic membrane, ear canal and external ear normal.     Nose: Nose normal.     Mouth/Throat:     Mouth: Mucous membranes are moist.     Pharynx: Oropharynx is clear.  Eyes:     Extraocular Movements: Extraocular movements intact.     Conjunctiva/sclera: Conjunctivae normal.     Pupils: Pupils are equal, round, and reactive to light.  Neck:     Thyroid: Thyroid mass and thyromegaly present.  Cardiovascular:     Rate and Rhythm: Normal rate and regular rhythm.     Pulses: Normal pulses.     Heart sounds: Normal heart sounds.  Pulmonary:     Effort: Pulmonary effort is normal.     Breath sounds: Normal breath sounds.  Chest:  Breasts:    Tanner Score is 5.     Right: Normal.     Left: Normal.  Abdominal:     General: Abdomen is flat. Bowel sounds are normal.     Palpations: Abdomen is soft.  Genitourinary:    Comments: deferred Musculoskeletal:        General: Normal range of motion.     Cervical back: Normal range of motion.  Skin:    General: Skin is warm and dry.  Neurological:     General: No focal deficit present.     Mental Status: She is alert and oriented to person, place, and time.  Psychiatric:        Mood and Affect: Mood normal.        Behavior: Behavior normal.     Assessment And Plan:     1. Annual physical exam Comments: A full exam was performed. Importance of monthly self breast exams was discussed with the patient.  PATIENT IS ADVISED TO GET 30-45 MINUTES REGULAR EXERCISE NO LESS THAN FOUR TO FIVE DAYS PER WEEK - BOTH WEIGHTBEARING EXERCISES AND AEROBIC ARE RECOMMENDED.  PATIENT IS ADVISED TO FOLLOW A HEALTHY DIET WITH AT LEAST SIX FRUITS/VEGGIES PER DAY, DECREASE INTAKE OF RED  MEAT, AND TO INCREASE FISH INTAKE TO TWO DAYS PER WEEK.  MEATS/FISH SHOULD NOT BE FRIED, BAKED OR BROILED IS PREFERABLE.  IT IS ALSO IMPORTANT TO CUT BACK ON YOUR SUGAR INTAKE. PLEASE AVOID ANYTHING WITH ADDED SUGAR, CORN SYRUP OR OTHER SWEETENERS. IF YOU MUST USE A SWEETENER, YOU CAN TRY STEVIA. IT IS ALSO IMPORTANT TO AVOID ARTIFICIALLY SWEETENERS AND DIET BEVERAGES. LASTLY, I SUGGEST WEARING SPF 50 SUNSCREEN ON EXPOSED PARTS AND ESPECIALLY WHEN IN THE DIRECT SUNLIGHT  FOR AN EXTENDED PERIOD OF TIME.  PLEASE AVOID FAST FOOD RESTAURANTS AND INCREASE YOUR WATER INTAKE.  2. Tinnitus of both ears Comments: Chronic, I will check with referral coordinator regarding ENT consultation placed in October 2023.  3. Thyroid nodule Comments: Most recent Endo note and u/s reviewed. I will check thyroid panel.  4. Class 1 obesity due to excess calories without serious comorbidity with body mass index (BMI) of 33.0 to 33.9 in adult Comments: She is encouraged to aim for at least 150 minutes of exercise per week, while striving for BMI<30 to decrease cardiac risk.  5. Immunization declined  Addendum: ENT contacted, they "had not gotten around to scheduling appt". Referral coordinator scheduled for 07/26/22.   Patient was given opportunity to ask questions. Patient verbalized understanding of the plan and was able to repeat key elements of the plan. All questions were answered to their satisfaction.   I, [name], MD, have reviewed all documentation for this visit. The documentation on 06/10/22 for the exam, diagnosis, procedures, and orders are all accurate and complete.   THE PATIENT IS ENCOURAGED TO PRACTICE SOCIAL DISTANCING DUE TO THE COVID-19 PANDEMIC.

## 2022-06-10 NOTE — Patient Instructions (Signed)

## 2022-06-11 LAB — CMP14+EGFR
ALT: 9 IU/L (ref 0–32)
AST: 11 IU/L (ref 0–40)
Albumin/Globulin Ratio: 1.6 (ref 1.2–2.2)
Albumin: 4.7 g/dL (ref 3.9–4.9)
Alkaline Phosphatase: 76 IU/L (ref 44–121)
BUN/Creatinine Ratio: 13 (ref 9–23)
BUN: 10 mg/dL (ref 6–24)
Bilirubin Total: 0.3 mg/dL (ref 0.0–1.2)
CO2: 24 mmol/L (ref 20–29)
Calcium: 9.6 mg/dL (ref 8.7–10.2)
Chloride: 100 mmol/L (ref 96–106)
Creatinine, Ser: 0.79 mg/dL (ref 0.57–1.00)
Globulin, Total: 2.9 g/dL (ref 1.5–4.5)
Glucose: 75 mg/dL (ref 70–99)
Potassium: 4.2 mmol/L (ref 3.5–5.2)
Sodium: 139 mmol/L (ref 134–144)
Total Protein: 7.6 g/dL (ref 6.0–8.5)
eGFR: 96 mL/min/{1.73_m2} (ref >59)

## 2022-06-11 LAB — TSH+FREE T4
Free T4: 1.05 ng/dL (ref 0.82–1.77)
TSH: 1.43 u[IU]/mL (ref 0.450–4.500)

## 2022-06-11 LAB — LIPID PANEL
Chol/HDL Ratio: 3 ratio (ref 0.0–4.4)
Cholesterol, Total: 197 mg/dL (ref 100–199)
HDL: 65 mg/dL (ref >39)
LDL Chol Calc (NIH): 116 mg/dL — ABNORMAL HIGH (ref 0–99)
Triglycerides: 87 mg/dL (ref 0–149)
VLDL Cholesterol Cal: 16 mg/dL (ref 5–40)

## 2022-06-11 LAB — CBC
Hematocrit: 39 % (ref 34.0–46.6)
Hemoglobin: 13.3 g/dL (ref 11.1–15.9)
MCH: 30.1 pg (ref 26.6–33.0)
MCHC: 34.1 g/dL (ref 31.5–35.7)
MCV: 88 fL (ref 79–97)
Platelets: 298 10*3/uL (ref 150–450)
RBC: 4.42 x10E6/uL (ref 3.77–5.28)
RDW: 12 % (ref 11.7–15.4)
WBC: 6.2 10*3/uL (ref 3.4–10.8)

## 2022-06-11 LAB — HEMOGLOBIN A1C
Est. average glucose Bld gHb Est-mCnc: 100 mg/dL
Hgb A1c MFr Bld: 5.1 % (ref 4.8–5.6)

## 2022-07-19 ENCOUNTER — Encounter: Payer: Self-pay | Admitting: Internal Medicine

## 2022-07-24 ENCOUNTER — Ambulatory Visit: Payer: BC Managed Care – PPO | Admitting: Internal Medicine

## 2022-09-13 ENCOUNTER — Encounter: Payer: Self-pay | Admitting: Gastroenterology

## 2022-10-26 ENCOUNTER — Other Ambulatory Visit: Payer: Self-pay

## 2022-10-26 ENCOUNTER — Emergency Department (HOSPITAL_BASED_OUTPATIENT_CLINIC_OR_DEPARTMENT_OTHER)
Admission: EM | Admit: 2022-10-26 | Discharge: 2022-10-26 | Disposition: A | Discharge status: Home / Self Care | Payer: BC Managed Care – PPO | Attending: Emergency Medicine | Admitting: Emergency Medicine

## 2022-10-26 DIAGNOSIS — R102 Pelvic and perineal pain: Secondary | ICD-10-CM | POA: Diagnosis not present

## 2022-10-26 LAB — COMPREHENSIVE METABOLIC PANEL
ALT: 7 U/L (ref 0–44)
AST: 10 U/L — ABNORMAL LOW (ref 15–41)
Albumin: 4.5 g/dL (ref 3.5–5.0)
Alkaline Phosphatase: 63 U/L (ref 38–126)
Anion gap: 9 (ref 5–15)
BUN: 11 mg/dL (ref 6–20)
CO2: 27 mmol/L (ref 22–32)
Calcium: 9.7 mg/dL (ref 8.9–10.3)
Chloride: 103 mmol/L (ref 98–111)
Creatinine, Ser: 0.77 mg/dL (ref 0.44–1.00)
GFR, Estimated: >60 mL/min (ref >60)
Glucose, Bld: 92 mg/dL (ref 70–99)
Potassium: 4.2 mmol/L (ref 3.5–5.1)
Sodium: 139 mmol/L (ref 135–145)
Total Bilirubin: 0.4 mg/dL (ref 0.3–1.2)
Total Protein: 7.4 g/dL (ref 6.5–8.1)

## 2022-10-26 LAB — URINALYSIS, ROUTINE W REFLEX MICROSCOPIC
Bilirubin Urine: NEGATIVE
Glucose, UA: NEGATIVE mg/dL
Hgb urine dipstick: NEGATIVE
Ketones, ur: NEGATIVE mg/dL
Leukocytes,Ua: NEGATIVE
Nitrite: NEGATIVE
Protein, ur: NEGATIVE mg/dL
Specific Gravity, Urine: 1.02 (ref 1.005–1.030)
pH: 7.5 (ref 5.0–8.0)

## 2022-10-26 LAB — WET PREP, GENITAL
Clue Cells Wet Prep HPF POC: NONE SEEN
Sperm: NONE SEEN
Trich, Wet Prep: NONE SEEN
WBC, Wet Prep HPF POC: <10 (ref <10)
Yeast Wet Prep HPF POC: NONE SEEN

## 2022-10-26 LAB — CBC
HCT: 38.1 % (ref 36.0–46.0)
Hemoglobin: 12.6 g/dL (ref 12.0–15.0)
MCH: 29.3 pg (ref 26.0–34.0)
MCHC: 33.1 g/dL (ref 30.0–36.0)
MCV: 88.6 fL (ref 80.0–100.0)
Platelets: 318 10*3/uL (ref 150–400)
RBC: 4.3 MIL/uL (ref 3.87–5.11)
RDW: 13.1 % (ref 11.5–15.5)
WBC: 7.8 10*3/uL (ref 4.0–10.5)
nRBC: 0 % (ref 0.0–0.2)

## 2022-10-26 LAB — LIPASE, BLOOD: Lipase: 18 U/L (ref 11–51)

## 2022-10-26 LAB — HCG, SERUM, QUALITATIVE: Preg, Serum: NEGATIVE

## 2022-10-26 NOTE — ED Provider Notes (Signed)
[place] EMERGENCY DEPARTMENT AT [facility] Provider Note   CSN: [id number] Arrival date & time: 10/26/22  1204     History  Chief Complaint  Patient presents with   Abdominal Pain   Spasms    Groin     Hannah Knapp is a 42 y.o. female.  Patient is a 42 year old female with a past medical history of fibroids presenting to the emergency department with pelvic pain.  Patient states that she was watching her son's soccer game this morning when she suddenly started to feel cramping and spasms within her vagina.  She states that she feels like she is having contractions and then will have intermittent spasms within her vaginal canal.  She denies any nausea or vomiting, fevers or chills, dysuria or hematuria or abnormal vaginal discharge.  She denies any diarrhea or constipation.  She states that she has had unprotected sex with her husband but denies any new sexual partners and has been monogamous for over the last 20 years.  She states that she did have 2 periods in the month of February that had a negative home pregnancy test around 2 weeks ago.  Her most recent LMP was last week.  The history is provided by the patient.  Abdominal Pain      Home Medications Prior to Admission medications   Medication Sig Start Date End Date Taking? Authorizing Provider  albuterol (VENTOLIN HFA) 108 (90 Base) MCG/ACT inhaler Inhale 2 puffs into the lungs every 6 (six) hours as needed for wheezing or shortness of breath. 06/10/22   Dorothyann Peng, MD  amoxicillin-clavulanate (AUGMENTIN) 875-125 MG tablet SMARTSIG:1 Tablet(s) By Mouth Every 12 Hours Patient not taking: Reported on 06/10/2022 04/22/22   [provider]  azelastine (ASTELIN) 0.1 % nasal spray azelastine 137 mcg (0.1 %) nasal spray aerosol 06/10/22   Dorothyann Peng, MD  EPINEPHrine (EPIPEN 2-PAK) 0.3 mg/0.3 mL IJ SOAJ injection EpiPen 2-Pak 0.3 mg/0.3 mL injection, auto-injector 06/10/22   Dorothyann Peng, MD  ibuprofen  (ADVIL,MOTRIN) 600 MG tablet Take 1 tablet (600 mg total) by mouth every 8 (eight) hours as needed for moderate pain. 02/19/16   Zadie Rhine, MD  levocetirizine (XYZAL) 5 MG tablet TAKE 1 TABLET BY MOUTH EVERY DAY IN THE EVENING 06/10/22   Dorothyann Peng, MD  VITAMIN D PO Take by mouth.    [provider]      Allergies    Azithromycin and Other    Review of Systems   Review of Systems  Gastrointestinal:  Positive for abdominal pain.    Physical Exam Updated Vital Signs BP (!) 112/92 (BP Location: Left Arm)   Pulse 77   Temp 98.3 F (36.8 C) (Oral)   Resp 18   Ht 5\' 2"  (1.575 m)   Wt 83.9 kg   SpO2 100%   BMI 33.84 kg/m  Physical Exam Vitals and nursing note reviewed. Exam conducted with a chaperone present.  Constitutional:      General: She is not in acute distress.    Appearance: She is well-developed.  HENT:     Head: Normocephalic and atraumatic.     Mouth/Throat:     Mouth: Mucous membranes are moist.     Pharynx: Oropharynx is clear.  Eyes:     Extraocular Movements: Extraocular movements intact.  Cardiovascular:     Rate and Rhythm: Normal rate and regular rhythm.     Heart sounds: Normal heart sounds.  Pulmonary:     Effort: Pulmonary effort is  normal.     Breath sounds: Normal breath sounds.  Abdominal:     General: Abdomen is flat.     Palpations: Abdomen is soft.     Tenderness: There is abdominal tenderness in the suprapubic area. There is no guarding or rebound.  Genitourinary:    Vagina: Normal.     Cervix: No cervical motion tenderness.     Uterus: Normal.      Adnexa:        Right: No mass or tenderness.         Left: No mass or tenderness.    Skin:    General: Skin is warm and dry.  Neurological:     General: No focal deficit present.     Mental Status: She is alert and oriented to person, place, and time.  Psychiatric:        Mood and Affect: Mood normal.        Behavior: Behavior normal.     ED Results / Procedures /  Treatments   Labs (all labs ordered are listed, but only abnormal results are displayed) Labs Reviewed  COMPREHENSIVE METABOLIC PANEL - Abnormal; Notable for the following components:      Result Value   AST 10 (*)    All other components within normal limits  WET PREP, GENITAL  LIPASE, BLOOD  CBC  HCG, SERUM, QUALITATIVE  URINALYSIS, ROUTINE W REFLEX MICROSCOPIC  GC/CHLAMYDIA PROBE AMP ([place]) NOT AT [facility]    EKG None  Radiology No results found.  Procedures Procedures    Medications Ordered in ED Medications - No data to display  ED Course/ Medical Decision Making/ A&P Clinical Course as of 10/26/22 1413  Sat Oct 26, 2022  1409 Wet prep negative, patient had no CMT or adnexal tenderness on pelvic exam making PID or ovarian torsion/TOA less likely. She is stable for discharge home with GYN follow up and was given strict return precautions. [VK]    Clinical Course User Index [VK] Rexford Maus, DO                             Medical Decision Making This patient presents to the ED with chief complaint(s) of pelvic pain with pertinent past medical history of fibroids which further complicates the presenting complaint. The complaint involves an extensive differential diagnosis and also carries with it a high risk of complications and morbidity.    The differential diagnosis includes pregnancy, UTI, STI, considering PID/TOA, other intra-abdominal infection  Additional history obtained: Additional history obtained from N/A Records reviewed Care Everywhere/External Records and Primary Care Documents  ED Course and Reassessment: On patient's arrival to the emergency department she is well-appearing in no acute distress.  She had labs evaluated and ordered by triage including CBC, CMP lipase as well as pregnancy test and will have UA to evaluate for UTI.  Patient's pregnancy test is negative and she will have a pelvic exam performed to further evaluate for STI, BV  or yeast as potential causes of her pelvic pain and she will be closely reassessed.  Independent labs interpretation:  The following labs were independently interpreted: within normal range  Independent visualization of imaging: N/A  Consultation: - Consulted or discussed management/test interpretation w/ external professional: N/A  Consideration for admission or further workup: Patient has no emergent conditions requiring admission or further work-up at this time and is stable for discharge home with primary care follow-up  Social Determinants  of health: N/A    Amount and/or Complexity of Data Reviewed Labs: ordered.          Final Clinical Impression(s) / ED Diagnoses Final diagnoses:  Pelvic pain    Rx / DC Orders ED Discharge Orders     None         Rexford MausKingsley, Azir Muzyka K, DO 10/26/22 1413

## 2022-10-26 NOTE — ED Triage Notes (Signed)
Patient arrives with complaints sudden onset of abdominal pain and groin spasms that started today.  Patient rates pain an 8/10. Last 2 periods were irregular, but home pregnancy test was negative.

## 2022-10-26 NOTE — Discharge Instructions (Signed)
You were seen in the emergency department for your pelvic pain.  Your workup showed no signs of infection and no obvious cause of your pain.  You can continue to take Tylenol and Motrin as needed for pain and both can be taken up to every 6 hours.  You should call your OB/GYN on Monday to schedule follow-up for reevaluation and you may need to get an ultrasound or further workup to determine the cause of your pain.  You should return to the emergency department if you are having significantly worsening pain, repetitive vomiting, fevers or if you have any other new or concerning symptoms.

## 2022-10-28 ENCOUNTER — Encounter: Payer: Self-pay | Admitting: Gastroenterology

## 2022-10-28 LAB — GC/CHLAMYDIA PROBE AMP (~~LOC~~) NOT AT ARMC
Chlamydia: NEGATIVE
Comment: NEGATIVE
Comment: NORMAL
Neisseria Gonorrhea: NEGATIVE

## 2022-10-30 ENCOUNTER — Encounter: Payer: Self-pay | Admitting: Internal Medicine

## 2022-11-04 ENCOUNTER — Other Ambulatory Visit: Payer: Self-pay | Admitting: Obstetrics and Gynecology

## 2022-11-04 DIAGNOSIS — R102 Pelvic and perineal pain: Secondary | ICD-10-CM

## 2022-11-06 ENCOUNTER — Ambulatory Visit: Payer: BC Managed Care – PPO | Admitting: Gastroenterology

## 2022-11-11 ENCOUNTER — Ambulatory Visit: Payer: BC Managed Care – PPO | Admitting: Gastroenterology

## 2022-11-11 ENCOUNTER — Ambulatory Visit: Payer: BC Managed Care – PPO | Admitting: Physician Assistant

## 2022-12-30 ENCOUNTER — Other Ambulatory Visit: Payer: BC Managed Care – PPO

## 2023-02-03 ENCOUNTER — Ambulatory Visit
Admission: RE | Admit: 2023-02-03 | Discharge: 2023-02-03 | Disposition: A | Discharge status: Home / Self Care | Payer: BC Managed Care – PPO | Source: Ambulatory Visit | Attending: Obstetrics and Gynecology | Admitting: Obstetrics and Gynecology

## 2023-02-03 DIAGNOSIS — R102 Pelvic and perineal pain: Secondary | ICD-10-CM

## 2023-02-10 ENCOUNTER — Ambulatory Visit
Admission: RE | Admit: 2023-02-10 | Discharge: 2023-02-10 | Disposition: A | Discharge status: Home / Self Care | Payer: BC Managed Care – PPO | Source: Ambulatory Visit | Attending: Obstetrics and Gynecology | Admitting: Obstetrics and Gynecology

## 2023-02-11 ENCOUNTER — Other Ambulatory Visit: Payer: Self-pay | Admitting: Internal Medicine

## 2023-02-11 DIAGNOSIS — R059 Cough, unspecified: Secondary | ICD-10-CM

## 2023-07-03 ENCOUNTER — Encounter: Payer: BC Managed Care – PPO | Admitting: Internal Medicine

## 2023-09-11 ENCOUNTER — Telehealth: Payer: Self-pay | Admitting: Internal Medicine

## 2023-09-11 ENCOUNTER — Ambulatory Visit (INDEPENDENT_AMBULATORY_CARE_PROVIDER_SITE_OTHER): Payer: 59 | Admitting: Internal Medicine

## 2023-09-11 ENCOUNTER — Encounter: Payer: Self-pay | Admitting: Internal Medicine

## 2023-09-11 VITALS — BP 110/80 | HR 98 | Temp 98.2°F | Ht 62.0 in | Wt 193.2 lb

## 2023-09-11 DIAGNOSIS — R0789 Other chest pain: Secondary | ICD-10-CM

## 2023-09-11 DIAGNOSIS — Z6835 Body mass index (BMI) 35.0-35.9, adult: Secondary | ICD-10-CM

## 2023-09-11 DIAGNOSIS — R202 Paresthesia of skin: Secondary | ICD-10-CM

## 2023-09-11 DIAGNOSIS — E66812 Obesity, class 2: Secondary | ICD-10-CM

## 2023-09-11 DIAGNOSIS — Z566 Other physical and mental strain related to work: Secondary | ICD-10-CM

## 2023-09-11 DIAGNOSIS — Z0000 Encounter for general adult medical examination without abnormal findings: Secondary | ICD-10-CM

## 2023-09-11 DIAGNOSIS — F410 Panic disorder [episodic paroxysmal anxiety] without agoraphobia: Secondary | ICD-10-CM

## 2023-09-11 DIAGNOSIS — E041 Nontoxic single thyroid nodule: Secondary | ICD-10-CM

## 2023-09-11 NOTE — Telephone Encounter (Signed)
CALLED PT TO COME IN AT 2:00 NO ANSWER LEFT VM

## 2023-09-11 NOTE — Progress Notes (Signed)
 I,Victoria T Deloria Lair, CMA,acting as a Neurosurgeon for Gwynneth Aliment, MD.,have documented all relevant documentation on the behalf of Gwynneth Aliment, MD,as directed by  Gwynneth Aliment, MD while in the presence of Gwynneth Aliment, MD.  Subjective:  Patient ID: Hannah Knapp , female    DOB: 06-14-1981 , 43 y.o.   MRN: [id number]  Chief Complaint  Patient presents with   Annual Exam    HPI  The patient is here for annual. The patient is followed by Dr. Jaymes Graff for GYN care.  She has no other concerns today.  She would like to discuss a recent incident. She thinks she had a panic attack.  She admits she is under a lot of stress at work.  She went to UC for her sx - states she had chest tightness and arm pain, sx occurred on January 27th.  She states an EKG was performed, advised it was normal.  She was also given a low dose of hydroxyzine, but she has not taken the medication.             Past Medical History:  Diagnosis Date   Fibroid    Hypothyroidism    Thyroid nodule      Family History  Problem Relation Age of Onset   Hypertension Maternal Grandmother    Migraines Maternal Grandmother    Fibromyalgia Maternal Grandmother    Depression Maternal Grandmother    Heart disease Maternal Grandfather      Current Outpatient Medications:    albuterol (VENTOLIN HFA) 108 (90 Base) MCG/ACT inhaler, Inhale 2 puffs into the lungs every 6 (six) hours as needed for wheezing or shortness of breath., Disp: 8 g, Rfl: 2   azelastine (ASTELIN) 0.1 % nasal spray, azelastine 137 mcg (0.1 %) nasal spray aerosol, Disp: 30 mL, Rfl: 1   EPINEPHrine (EPIPEN 2-PAK) 0.3 mg/0.3 mL IJ SOAJ injection, EpiPen 2-Pak 0.3 mg/0.3 mL injection, auto-injector, Disp: 2 each, Rfl: 1   ibuprofen (ADVIL,MOTRIN) 600 MG tablet, Take 1 tablet (600 mg total) by mouth every 8 (eight) hours as needed for moderate pain., Disp: 30 tablet, Rfl: 0   levocetirizine (XYZAL) 5 MG tablet, TAKE 1 TABLET BY MOUTH EVERY DAY IN  THE EVENING, Disp: 90 tablet, Rfl: 1   VITAMIN D PO, Take by mouth., Disp: , Rfl:    amoxicillin-clavulanate (AUGMENTIN) 875-125 MG tablet, SMARTSIG:1 Tablet(s) By Mouth Every 12 Hours (Patient not taking: Reported on 09/11/2023), Disp: , Rfl:    Allergies  Allergen Reactions   Azithromycin Other (See Comments)    Makes patient very sick   Other Hives and Itching    Shellfish and allergic to molds and pet dander     Review of Systems  Constitutional: Negative.   HENT: Negative.    Eyes: Negative.   Respiratory: Negative.    Cardiovascular: Negative.   Gastrointestinal: Negative.   Endocrine: Negative.   Genitourinary: Negative.   Musculoskeletal: Negative.   Skin: Negative.   Allergic/Immunologic: Negative.   Neurological: Negative.   Hematological: Negative.   Psychiatric/Behavioral:  The patient is nervous/anxious.      Today's Vitals   09/11/23 1452  BP: 110/80  Pulse: 98  Temp: 98.2 F (36.8 C)  SpO2: 98%  Weight: 193 lb 3.2 oz (87.6 kg)  Height: 5\' 2"  (1.575 m)   Body mass index is 35.34 kg/m.  Wt Readings from Last 3 Encounters:  09/11/23 193 lb 3.2 oz (87.6 kg)  10/26/22 185 lb (83.9 kg)  06/10/22 186 lb 3.2 oz (84.5 kg)     Objective:  Physical Exam Vitals and nursing note reviewed.  Constitutional:      Appearance: Normal appearance.  HENT:     Head: Normocephalic and atraumatic.     Right Ear: Tympanic membrane, ear canal and external ear normal.     Left Ear: Tympanic membrane, ear canal and external ear normal.     Nose: Nose normal.     Mouth/Throat:     Mouth: Mucous membranes are moist.     Pharynx: Oropharynx is clear.  Eyes:     Extraocular Movements: Extraocular movements intact.     Conjunctiva/sclera: Conjunctivae normal.     Pupils: Pupils are equal, round, and reactive to light.  Neck:     Thyroid: Thyroid mass and thyromegaly present.     Comments: Multinodular goiter Cardiovascular:     Rate and Rhythm: Normal rate and regular  rhythm.     Pulses: Normal pulses.          Dorsalis pedis pulses are 2+ on the left side.     Heart sounds: Normal heart sounds.  Pulmonary:     Effort: Pulmonary effort is normal.     Breath sounds: Normal breath sounds.  Chest:  Breasts:    Tanner Score is 5.     Right: Normal.     Left: Normal.  Abdominal:     General: Bowel sounds are normal.     Palpations: Abdomen is soft.  Genitourinary:    Comments: deferred Musculoskeletal:        General: Normal range of motion.     Cervical back: Normal range of motion.  Skin:    General: Skin is warm and dry.  Neurological:     General: No focal deficit present.     Mental Status: She is alert and oriented to person, place, and time.  Psychiatric:        Mood and Affect: Mood normal.        Behavior: Behavior normal.         Assessment And Plan:  Annual physical exam Assessment & Plan: A full exam was performed.  Importance of monthly self breast exams was discussed with the patient.  She is advised to get 30-45 minutes of regular exercise, no less than four to five days per week. Both weight-bearing and aerobic exercises are recommended.  She is advised to follow a healthy diet with at least six fruits/veggies per day, decrease intake of red meat and other saturated fats and to increase fish intake to twice weekly.  Meats/fish should not be fried -- baked, boiled or broiled is preferable. It is also important to cut back on your sugar intake.  Be sure to read labels - try to avoid anything with added sugar, high fructose corn syrup or other sweeteners.  If you must use a sweetener, you can try stevia or monkfruit.  It is also important to avoid artificially sweetened foods/beverages and diet drinks. Lastly, wear SPF 50 sunscreen on exposed skin and when in direct sunlight for an extended period of time.  Be sure to avoid fast food restaurants and aim for at least 60 ounces of water daily.      Orders: -     CBC -      CMP14+EGFR -     Lipid panel -     Hemoglobin A1c  Panic attack Assessment & Plan: Urgent Care notes reviewed in full detail.  She  is advised to take hydroxyzine prescribed by UC provider. She is also encouraged to start therapy to help with her work issues.    Atypical chest pain Assessment & Plan: I do think her sx were due to anxiety.  Urgent care notes reviewed in Care Everywhere. No need to repeat EKG today.    Thyroid nodule Assessment & Plan: Chronic, recent surgical notes reviewed.  She does not wish to move forward with surgical excision at this time. She is still followed by Dr. Talmage Nap, her input is appreciated.  She is aware of what compressive symptoms are and will notify me if she develops these symptoms.   Orders: -     TSH + free T4  Paresthesia of left lower extremity Assessment & Plan: Sx usually occur after getting up in am or after being seated for a long period of time. Possibly due to lumbar spine disease. I will check labs as below.   Orders: -     Vitamin B12  Class 2 severe obesity due to excess calories with serious comorbidity and body mass index (BMI) of 35.0 to 35.9 in adult [facility]) Assessment & Plan: She is encouraged to strive for BMI less than 30 to decrease cardiac risk. Advised to aim for at least 150 minutes of exercise per week.    Work stress Assessment & Plan: She is encouraged to complete FMLA paperwork, especially given recent diagnosis of panic disorder.   Orders: -     Ambulatory referral to Psychology  She is encouraged to strive for BMI less than 30 to decrease cardiac risk. Advised to aim for at least 150 minutes of exercise per week.    Return for 1 year HM, .  Patient was given opportunity to ask questions. Patient verbalized understanding of the plan and was able to repeat key elements of the plan. All questions were answered to their satisfaction.    I, Gwynneth Aliment, MD, have reviewed all documentation for this visit. The  documentation on 09/11/23 for the exam, diagnosis, procedures, and orders are all accurate and complete.   IF YOU HAVE BEEN REFERRED TO A SPECIALIST, IT MAY TAKE 1-2 WEEKS TO SCHEDULE/PROCESS THE REFERRAL. IF YOU HAVE NOT HEARD FROM US/SPECIALIST IN TWO WEEKS, PLEASE GIVE [place] A CALL AT [phone] X [phone].   THE PATIENT IS ENCOURAGED TO PRACTICE SOCIAL DISTANCING DUE TO THE COVID-19 PANDEMIC.

## 2023-09-11 NOTE — Patient Instructions (Signed)

## 2023-09-11 NOTE — Telephone Encounter (Signed)
Patient called back to advise that she could be there no earlier than 245pm. Please advise patient if that is ok or if she needs to reschedule.

## 2023-09-11 NOTE — Assessment & Plan Note (Signed)
 She is encouraged to strive for BMI less than 30 to decrease cardiac risk. Advised to aim for at least 150 minutes of exercise per week.

## 2023-09-11 NOTE — Assessment & Plan Note (Signed)

## 2023-09-12 LAB — CBC
Hematocrit: 39.6 % (ref 34.0–46.6)
Hemoglobin: 13.1 g/dL (ref 11.1–15.9)
MCH: 28.9 pg (ref 26.6–33.0)
MCHC: 33.1 g/dL (ref 31.5–35.7)
MCV: 87 fL (ref 79–97)
Platelets: 330 10*3/uL (ref 150–450)
RBC: 4.54 x10E6/uL (ref 3.77–5.28)
RDW: 12.3 % (ref 11.7–15.4)
WBC: 7.4 10*3/uL (ref 3.4–10.8)

## 2023-09-12 LAB — HEMOGLOBIN A1C
Est. average glucose Bld gHb Est-mCnc: 97 mg/dL
Hgb A1c MFr Bld: 5 % (ref 4.8–5.6)

## 2023-09-12 LAB — CMP14+EGFR
ALT: 13 [IU]/L (ref 0–32)
AST: 15 [IU]/L (ref 0–40)
Albumin: 4.5 g/dL (ref 3.9–4.9)
Alkaline Phosphatase: 75 [IU]/L (ref 44–121)
BUN/Creatinine Ratio: 14 (ref 9–23)
BUN: 10 mg/dL (ref 6–24)
Bilirubin Total: <0.2 mg/dL (ref 0.0–1.2)
CO2: 26 mmol/L (ref 20–29)
Calcium: 9.4 mg/dL (ref 8.7–10.2)
Chloride: 103 mmol/L (ref 96–106)
Creatinine, Ser: 0.72 mg/dL (ref 0.57–1.00)
Globulin, Total: 2.6 g/dL (ref 1.5–4.5)
Glucose: 72 mg/dL (ref 70–99)
Potassium: 4.2 mmol/L (ref 3.5–5.2)
Sodium: 140 mmol/L (ref 134–144)
Total Protein: 7.1 g/dL (ref 6.0–8.5)
eGFR: 107 mL/min/{1.73_m2} (ref >59)

## 2023-09-12 LAB — LIPID PANEL
Chol/HDL Ratio: 3.6 {ratio} (ref 0.0–4.4)
Cholesterol, Total: 200 mg/dL — ABNORMAL HIGH (ref 100–199)
HDL: 55 mg/dL (ref >39)
LDL Chol Calc (NIH): 112 mg/dL — ABNORMAL HIGH (ref 0–99)
Triglycerides: 188 mg/dL — ABNORMAL HIGH (ref 0–149)
VLDL Cholesterol Cal: 33 mg/dL (ref 5–40)

## 2023-09-12 LAB — TSH+FREE T4
Free T4: 0.86 ng/dL (ref 0.82–1.77)
TSH: 1.07 u[IU]/mL (ref 0.450–4.500)

## 2023-09-12 LAB — VITAMIN B12: Vitamin B-12: 352 pg/mL (ref 232–1245)

## 2023-09-13 DIAGNOSIS — Z566 Other physical and mental strain related to work: Secondary | ICD-10-CM | POA: Insufficient documentation

## 2023-09-13 DIAGNOSIS — F410 Panic disorder [episodic paroxysmal anxiety] without agoraphobia: Secondary | ICD-10-CM | POA: Insufficient documentation

## 2023-09-13 NOTE — Assessment & Plan Note (Signed)
 Chronic, recent surgical notes reviewed.  She does not wish to move forward with surgical excision at this time. She is still followed by Dr. Talmage Nap, her input is appreciated.  She is aware of what compressive symptoms are and will notify me if she develops these symptoms.

## 2023-09-13 NOTE — Assessment & Plan Note (Signed)
 She is encouraged to complete FMLA paperwork, especially given recent diagnosis of panic disorder.

## 2023-09-13 NOTE — Assessment & Plan Note (Signed)
 Urgent Care notes reviewed in full detail.  She is advised to take hydroxyzine prescribed by UC provider. She is also encouraged to start therapy to help with her work issues.

## 2023-09-13 NOTE — Assessment & Plan Note (Signed)
 Sx usually occur after getting up in am or after being seated for a long period of time. Possibly due to lumbar spine disease. I will check labs as below.

## 2023-09-13 NOTE — Assessment & Plan Note (Signed)
 I do think her sx were due to anxiety.  Urgent care notes reviewed in Care Everywhere. No need to repeat EKG today.

## 2023-09-22 ENCOUNTER — Encounter: Payer: Self-pay | Admitting: Internal Medicine

## 2023-09-29 ENCOUNTER — Encounter: Payer: Self-pay | Admitting: Internal Medicine

## 2023-09-30 ENCOUNTER — Other Ambulatory Visit: Payer: Self-pay | Admitting: Internal Medicine

## 2023-09-30 MED ORDER — FLUCONAZOLE 150 MG PO TABS: ORAL_TABLET | ORAL | 0 refills | Status: DC

## 2023-10-03 ENCOUNTER — Ambulatory Visit: Payer: Self-pay | Admitting: Family Medicine

## 2023-11-01 ENCOUNTER — Other Ambulatory Visit: Payer: Self-pay | Admitting: Internal Medicine

## 2023-11-01 DIAGNOSIS — R059 Cough, unspecified: Secondary | ICD-10-CM

## 2023-12-11 ENCOUNTER — Other Ambulatory Visit: Payer: Self-pay | Admitting: Endocrinology

## 2023-12-11 DIAGNOSIS — E041 Nontoxic single thyroid nodule: Secondary | ICD-10-CM

## 2023-12-23 ENCOUNTER — Other Ambulatory Visit: Payer: Self-pay | Admitting: Obstetrics and Gynecology

## 2023-12-26 LAB — SURGICAL PATHOLOGY

## 2024-01-07 ENCOUNTER — Other Ambulatory Visit: Payer: Self-pay

## 2024-01-07 ENCOUNTER — Other Ambulatory Visit: Payer: Self-pay | Admitting: Internal Medicine

## 2024-01-07 DIAGNOSIS — R059 Cough, unspecified: Secondary | ICD-10-CM

## 2024-01-16 ENCOUNTER — Ambulatory Visit
Admission: RE | Admit: 2024-01-16 | Discharge: 2024-01-16 | Disposition: A | Discharge status: Home / Self Care | Payer: Self-pay | Source: Ambulatory Visit | Attending: Endocrinology | Admitting: Endocrinology

## 2024-01-16 DIAGNOSIS — E041 Nontoxic single thyroid nodule: Secondary | ICD-10-CM

## 2024-04-01 ENCOUNTER — Encounter: Payer: Self-pay | Admitting: Internal Medicine

## 2024-05-31 ENCOUNTER — Encounter (HOSPITAL_COMMUNITY): Payer: Self-pay

## 2024-05-31 ENCOUNTER — Other Ambulatory Visit: Payer: Self-pay

## 2024-05-31 ENCOUNTER — Emergency Department (HOSPITAL_COMMUNITY)

## 2024-05-31 ENCOUNTER — Emergency Department (HOSPITAL_COMMUNITY)
Admission: EM | Admit: 2024-05-31 | Discharge: 2024-05-31 | Disposition: A | Discharge status: Home / Self Care | Source: Ambulatory Visit

## 2024-05-31 DIAGNOSIS — R1031 Right lower quadrant pain: Secondary | ICD-10-CM | POA: Insufficient documentation

## 2024-05-31 DIAGNOSIS — E039 Hypothyroidism, unspecified: Secondary | ICD-10-CM | POA: Diagnosis not present

## 2024-05-31 LAB — LIPASE, BLOOD: Lipase: 43 U/L (ref 11–51)

## 2024-05-31 LAB — CBC WITH DIFFERENTIAL/PLATELET
Abs Immature Granulocytes: 0.01 K/uL (ref 0.00–0.07)
Basophils Absolute: 0 K/uL (ref 0.0–0.1)
Basophils Relative: 0 %
Eosinophils Absolute: 0 K/uL (ref 0.0–0.5)
Eosinophils Relative: 1 %
HCT: 39.4 % (ref 36.0–46.0)
Hemoglobin: 12.8 g/dL (ref 12.0–15.0)
Immature Granulocytes: 0 %
Lymphocytes Relative: 29 %
Lymphs Abs: 2.3 K/uL (ref 0.7–4.0)
MCH: 28.9 pg (ref 26.0–34.0)
MCHC: 32.5 g/dL (ref 30.0–36.0)
MCV: 88.9 fL (ref 80.0–100.0)
Monocytes Absolute: 0.6 K/uL (ref 0.1–1.0)
Monocytes Relative: 7 %
Neutro Abs: 4.9 K/uL (ref 1.7–7.7)
Neutrophils Relative %: 63 %
Platelets: 277 K/uL (ref 150–400)
RBC: 4.43 MIL/uL (ref 3.87–5.11)
RDW: 13.6 % (ref 11.5–15.5)
WBC: 7.8 K/uL (ref 4.0–10.5)
nRBC: 0 % (ref 0.0–0.2)

## 2024-05-31 LAB — COMPREHENSIVE METABOLIC PANEL WITH GFR
ALT: 9 U/L (ref 0–44)
AST: 17 U/L (ref 15–41)
Albumin: 4.4 g/dL (ref 3.5–5.0)
Alkaline Phosphatase: 74 U/L (ref 38–126)
Anion gap: 10 (ref 5–15)
BUN: 8 mg/dL (ref 6–20)
CO2: 25 mmol/L (ref 22–32)
Calcium: 9.5 mg/dL (ref 8.9–10.3)
Chloride: 103 mmol/L (ref 98–111)
Creatinine, Ser: 0.79 mg/dL (ref 0.44–1.00)
GFR, Estimated: >60 mL/min (ref >60)
Glucose, Bld: 85 mg/dL (ref 70–99)
Potassium: 4.4 mmol/L (ref 3.5–5.1)
Sodium: 138 mmol/L (ref 135–145)
Total Bilirubin: 0.4 mg/dL (ref 0.0–1.2)
Total Protein: 7.9 g/dL (ref 6.5–8.1)

## 2024-05-31 LAB — HCG, SERUM, QUALITATIVE: Preg, Serum: NEGATIVE

## 2024-05-31 MED ORDER — IOHEXOL 300 MG/ML  SOLN
100.0000 mL | Freq: Once | INTRAMUSCULAR | Status: AC | PRN
  Administered 2024-05-31: 100 mL via INTRAVENOUS

## 2024-05-31 NOTE — ED Provider Notes (Signed)
 [place] EMERGENCY DEPARTMENT AT [facility] Provider Note   CSN: [id number] Arrival date & time: 05/31/24  1055     Patient presents with: Abdominal Pain   Hannah Knapp is a 43 y.o. female who presents to the ED with a chief complaint of RLQ abdominal pain. Patient began having RLQ abdominal pain that started this morning waking her up out of her sleep. Patient states that pain was very severe for approximately 5-8 minutes and then has remained a dull ache all day. Denies hx of abdominal pain other than fibroid pain. Denies fever, chills, chest pain, shortness of breath, urinary symptoms. Appreciates normal bowel movements.  Denies nausea or vomiting.  Patient states that the pain is a dull ache that is a 2 or 3 out of 10 however when pressure is applied to this area pain is 10 out of 10.  Patient denies history of abdominal surgeries and still has her appendix.  Past medical history significant for fibroids, thyroid  nodules, seasonal allergies, etc. Denies abnormal vaginal discharge and denies concern for STDs. Denies urinary symptoms.     Abdominal Pain      Prior to Admission medications   Medication Sig Start Date End Date Taking? Authorizing Provider  albuterol  (VENTOLIN  HFA) 108 (90 Base) MCG/ACT inhaler Inhale 2 puffs into the lungs every 6 (six) hours as needed for wheezing or shortness of breath. 06/10/22   Jarold Medici, MD  amoxicillin -clavulanate (AUGMENTIN ) 875-125 MG tablet SMARTSIG:1 Tablet(s) By Mouth Every 12 Hours Patient not taking: Reported on 09/11/2023 04/22/22   [provider]  Azelastine  HCl 137 MCG/SPRAY SOLN USE AS DIRECTED 01/08/24   Jarold Medici, MD  EPINEPHrine  (EPIPEN  2-PAK) 0.3 mg/0.3 mL IJ SOAJ injection EpiPen  2-Pak 0.3 mg/0.3 mL injection, auto-injector 06/10/22   Jarold Medici, MD  fluconazole  (DIFLUCAN ) 150 MG tablet One tab po today, repeat in 48 hours if needed 09/30/23   Jarold Medici, MD  ibuprofen  (ADVIL ,MOTRIN ) 600 MG  tablet Take 1 tablet (600 mg total) by mouth every 8 (eight) hours as needed for moderate pain. 02/19/16   Midge Golas, MD  levocetirizine (XYZAL ) 5 MG tablet TAKE 1 TABLET BY MOUTH EVERY DAY IN THE EVENING 11/03/23   Jarold Medici, MD  VITAMIN D  PO Take by mouth.    [provider]    Allergies: Azithromycin and Other    Review of Systems  Gastrointestinal:  Positive for abdominal pain.    Updated Vital Signs BP 126/89   Pulse 86   Temp 98.2 F (36.8 C)   Resp 16   SpO2 100%   Physical Exam Vitals and nursing note reviewed.  Constitutional:      General: She is awake. She is not in acute distress.    Appearance: She is well-developed and normal weight. She is not ill-appearing, toxic-appearing or diaphoretic.  HENT:     Head: Normocephalic and atraumatic.  Eyes:     General: No scleral icterus. Cardiovascular:     Rate and Rhythm: Normal rate and regular rhythm.  Pulmonary:     Effort: Pulmonary effort is normal. No respiratory distress.     Breath sounds: No wheezing, rhonchi or rales.  Abdominal:     General: There is no distension.     Palpations: Abdomen is soft.     Tenderness: There is abdominal tenderness in the right lower quadrant. There is no right CVA tenderness, left CVA tenderness, guarding or rebound. Positive signs include McBurney's sign. Negative signs include Murphy's sign.  Skin:    General: Skin is warm.     Capillary Refill: Capillary refill takes less than 2 seconds.  Neurological:     General: No focal deficit present.     Mental Status: She is alert and oriented to person, place, and time.  Psychiatric:        Mood and Affect: Mood normal.        Behavior: Behavior normal. Behavior is cooperative.     (all labs ordered are listed, but only abnormal results are displayed) Labs Reviewed  COMPREHENSIVE METABOLIC PANEL WITH GFR  LIPASE, BLOOD  CBC WITH DIFFERENTIAL/PLATELET  HCG, SERUM, QUALITATIVE     EKG: None  Radiology: US  Pelvis Complete Result Date: 05/31/2024 CLINICAL DATA:  Right-sided pelvic pain, tenderness to palpation, known history of fibroids EXAM: TRANSABDOMINAL AND TRANSVAGINAL ULTRASOUND OF PELVIS DOPPLER ULTRASOUND OF OVARIES TECHNIQUE: Both transabdominal and transvaginal ultrasound examinations of the pelvis were performed. Transabdominal technique was performed for global imaging of the pelvis including uterus, ovaries, adnexal regions, and pelvic cul-de-sac. It was necessary to proceed with endovaginal exam following the transabdominal exam to visualize the uterus, endometrium, ovaries, and adnexa. Color and duplex Doppler ultrasound was utilized to evaluate blood flow to the ovaries. COMPARISON:  CT abdomen pelvis, 05/31/2024 FINDINGS: Per technologist report, transvaginal examination was terminated at patient request due to discomfort and is therefore incomplete. Uterus Measurements: 10.6 x 7.3 x 7.3 cm = volume: 294 mL. Intramural fibroids of the uterine fundus measuring up to 2.8 cm. Endometrium Not clearly visualized, obscured by shadowing fibroids. Right ovary Measurements: 3.6 x 3.9 x 2.8 cm = volume: 21 mL. Only visualized transabdominally, mildly enlarged by a simple appearing cyst or follicle measuring 2.6 cm. Left ovary Not visualized. Pulsed Doppler evaluation of both ovaries demonstrates normal low-resistance arterial and venous waveforms of the right ovary. Left ovary is not visualized. Other findings No abnormal free fluid. IMPRESSION: 1. Intramural fibroids of the uterine fundus measuring up to 2.8 cm. 2. Endometrium not clearly visualized, obscured by shadowing fibroids on transabdominal examination and not assessed on incomplete transvaginal examination. 3. Right ovary only visualized transabdominally, mildly enlarged by a simple appearing cyst or follicle measuring 2.6 cm. Arterial and venous Doppler flow is present to the right ovary. 4. Left ovary not  visualized. Electronically Signed   By: Marolyn JONETTA Jaksch M.D.   On: 05/31/2024 17:06   US  Transvaginal Non-OB Result Date: 05/31/2024 CLINICAL DATA:  Right-sided pelvic pain, tenderness to palpation, known history of fibroids EXAM: TRANSABDOMINAL AND TRANSVAGINAL ULTRASOUND OF PELVIS DOPPLER ULTRASOUND OF OVARIES TECHNIQUE: Both transabdominal and transvaginal ultrasound examinations of the pelvis were performed. Transabdominal technique was performed for global imaging of the pelvis including uterus, ovaries, adnexal regions, and pelvic cul-de-sac. It was necessary to proceed with endovaginal exam following the transabdominal exam to visualize the uterus, endometrium, ovaries, and adnexa. Color and duplex Doppler ultrasound was utilized to evaluate blood flow to the ovaries. COMPARISON:  CT abdomen pelvis, 05/31/2024 FINDINGS: Per technologist report, transvaginal examination was terminated at patient request due to discomfort and is therefore incomplete. Uterus Measurements: 10.6 x 7.3 x 7.3 cm = volume: 294 mL. Intramural fibroids of the uterine fundus measuring up to 2.8 cm. Endometrium Not clearly visualized, obscured by shadowing fibroids. Right ovary Measurements: 3.6 x 3.9 x 2.8 cm = volume: 21 mL. Only visualized transabdominally, mildly enlarged by a simple appearing cyst or follicle measuring 2.6 cm. Left ovary Not visualized. Pulsed Doppler evaluation of both ovaries demonstrates normal low-resistance arterial  and venous waveforms of the right ovary. Left ovary is not visualized. Other findings No abnormal free fluid. IMPRESSION: 1. Intramural fibroids of the uterine fundus measuring up to 2.8 cm. 2. Endometrium not clearly visualized, obscured by shadowing fibroids on transabdominal examination and not assessed on incomplete transvaginal examination. 3. Right ovary only visualized transabdominally, mildly enlarged by a simple appearing cyst or follicle measuring 2.6 cm. Arterial and venous Doppler flow  is present to the right ovary. 4. Left ovary not visualized. Electronically Signed   By: Marolyn JONETTA Jaksch M.D.   On: 05/31/2024 17:06   US  Art/Ven Flow Abd Pelv Doppler Result Date: 05/31/2024 CLINICAL DATA:  Right-sided pelvic pain, tenderness to palpation, known history of fibroids EXAM: TRANSABDOMINAL AND TRANSVAGINAL ULTRASOUND OF PELVIS DOPPLER ULTRASOUND OF OVARIES TECHNIQUE: Both transabdominal and transvaginal ultrasound examinations of the pelvis were performed. Transabdominal technique was performed for global imaging of the pelvis including uterus, ovaries, adnexal regions, and pelvic cul-de-sac. It was necessary to proceed with endovaginal exam following the transabdominal exam to visualize the uterus, endometrium, ovaries, and adnexa. Color and duplex Doppler ultrasound was utilized to evaluate blood flow to the ovaries. COMPARISON:  CT abdomen pelvis, 05/31/2024 FINDINGS: Per technologist report, transvaginal examination was terminated at patient request due to discomfort and is therefore incomplete. Uterus Measurements: 10.6 x 7.3 x 7.3 cm = volume: 294 mL. Intramural fibroids of the uterine fundus measuring up to 2.8 cm. Endometrium Not clearly visualized, obscured by shadowing fibroids. Right ovary Measurements: 3.6 x 3.9 x 2.8 cm = volume: 21 mL. Only visualized transabdominally, mildly enlarged by a simple appearing cyst or follicle measuring 2.6 cm. Left ovary Not visualized. Pulsed Doppler evaluation of both ovaries demonstrates normal low-resistance arterial and venous waveforms of the right ovary. Left ovary is not visualized. Other findings No abnormal free fluid. IMPRESSION: 1. Intramural fibroids of the uterine fundus measuring up to 2.8 cm. 2. Endometrium not clearly visualized, obscured by shadowing fibroids on transabdominal examination and not assessed on incomplete transvaginal examination. 3. Right ovary only visualized transabdominally, mildly enlarged by a simple appearing cyst or  follicle measuring 2.6 cm. Arterial and venous Doppler flow is present to the right ovary. 4. Left ovary not visualized. Electronically Signed   By: Marolyn JONETTA Jaksch M.D.   On: 05/31/2024 17:06   CT ABDOMEN PELVIS W CONTRAST Result Date: 05/31/2024 CLINICAL DATA:  Right lower quadrant abdominal pain. EXAM: CT ABDOMEN AND PELVIS WITH CONTRAST TECHNIQUE: Multidetector CT imaging of the abdomen and pelvis was performed using the standard protocol following bolus administration of intravenous contrast. RADIATION DOSE REDUCTION: This exam was performed according to the departmental dose-optimization program which includes automated exposure control, adjustment of the mA and/or kV according to patient size and/or use of iterative reconstruction technique. CONTRAST:  100mL OMNIPAQUE IOHEXOL 300 MG/ML  SOLN COMPARISON:  None Available. FINDINGS: Lower chest: The visualized lung bases are clear. No intra-abdominal free air or free fluid. Hepatobiliary: The liver is unremarkable. No acute or dilatation. The gallbladder is unremarkable. Pancreas: Unremarkable. No pancreatic ductal dilatation or surrounding inflammatory changes. Spleen: Normal in size without focal abnormality. Adrenals/Urinary Tract: The adrenal glands are unremarkable. The kidneys, visualized ureters, and urinary bladder appear unremarkable. Stomach/Bowel: There is no bowel obstruction or active inflammation. The appendix is normal. Vascular/Lymphatic: The abdominal aorta and IVC unremarkable. No portal venous gas. There is no adenopathy. Reproductive: The uterus is anteverted. Several uterine fibroids noted. There is a 3 cm right ovarian dominant follicle or corpus luteum. No suspicious  adnexal masses. Other: Broad-based diastasis of anterior abdominal wall musculature at the umbilicus. Musculoskeletal: No acute or significant osseous findings. IMPRESSION: 1. No acute intra-abdominal or pelvic pathology. Normal appendix. 2. Uterine fibroids. 3. A 3 cm right  ovarian dominant follicle or corpus luteum. Electronically Signed   By: Vanetta Chou M.D.   On: 05/31/2024 15:22     Procedures   Medications Ordered in the ED  iohexol (OMNIPAQUE) 300 MG/ML solution 100 mL (100 mLs Intravenous Contrast Given 05/31/24 1433)                                    Medical Decision Making Amount and/or Complexity of Data Reviewed Labs: ordered. Radiology: ordered.  Risk Prescription drug management.   Patient presents to the ED for concern of abdominal pain, this involves an extensive number of treatment options, and is a complaint that carries with it a high risk of complications and morbidity.  The differential diagnosis includes appendicitis, cholecystitis, gastritis, diverticulitis, fibroids, ovarian torsion, ectopic pregnancy, etc.   Co morbidities that complicate the patient evaluation  Fibroids, hypothyroidism, panic attacks, seasonal allergies   Additional history obtained:  Briefly reviewed urgent care note from today 05/31/2024 prior to patient being sent over to the emergency department, at the urgent care a urinalysis was completed which was not consistent with a urinary tract infection and was reassuring   Lab Tests:  I Ordered, and personally interpreted labs.  The pertinent results include: CBC, CMP, lipase all reassuring, pregnancy negative, urinalysis reviewed from urgent care   Imaging Studies ordered:  I ordered imaging studies including CT abdomen pelvis with contrast as well as pelvic ultrasound I independently visualized and interpreted imaging which showed:  CT abdomen pelvis: No acute intra-abdominal or pelvic pathology, normal appendix, uterine fibroids Pelvic ultrasound: Intramural fibroids of the uterine fundus measuring up to 2.8 cm, endometrium is not clearly visualized, fibroids, right ovary mildly enlarged with a simple appearing cyst or follicle measuring 2.6 cm, arterial and venous Doppler flow present to the  right ovary, left ovary unfortunately was not visualized I agree with the radiologist interpretation   Medicines ordered and prescription drug management:  I have reviewed the patients home medicines and have made adjustments as needed   Test Considered:  Pelvic exam: Deferred at this time as patient denies abnormal vaginal discharge, urinary symptoms, also denies concern for STDs, lab work and imaging here are reassuring, I do not think that pelvic exam would change management at this time, patient is established with OBGYN   Critical Interventions:  None   Consultations Obtained:  I requested consultation with the Obgyn team (Dr. Eveline),  and discussed lab and imaging findings as well as pertinent plan - they recommend: outpatient follow-up    Problem List / ED Course:  42 year old female, vital signs stable, presents to the emergency department for chief complaint of right lower quadrant abdominal pain, sent over from urgent care due to possible need for imaging On physical exam patient has point tenderness over right lower quadrant as well as periumbilical area.  Patient's labs are reassuring, no elevated white blood cell count, hemoglobin unremarkable, CMP unremarkable, pregnancy is negative Due to point tenderness in right lower quadrant will order CT abdomen pelvis with contrast at this time, if CT is reassuring may consider pelvic ultrasound due to degree of pain with palpation and an otherwise healthy female Patient deferred pain medication at  this time Labs and CT scan reassuring however patient still in significant pain with palpation, will obtain pelvic ultrasound and reassess  Pelvic ultrasound significant for right 2.6 cm simple appearing cyst, as well as fibroids. Endometrium was not fully visualized and left ovary was not visualized on this examination.  Spoke with attending Dr. Ellouise who assessed the patient and recommended OBGYN recommendation due to possibility  of ovarian torsion that has now resolved, spoke with Dr. Eveline with OBGYN who recommended outpatient follow-up since symptoms have improved and no evidence of torsion currently Patient educated on findings today and workup Return precautions given, and patient advised to follow-up with established OBGYN within the next 1-2 weeks if possible. Strict return precautions given surrounding symptoms worsening and return to the ED.  Patient discharged Unknown diagnosis for right lower quadrant versus right pelvic pain at this time however reassuring imaging and lab work here in the emergency department   Reevaluation:  After the interventions noted above, I reevaluated the patient and found that they have :stayed the same   Social Determinants of Health:  None   Dispostion:  After consideration of the diagnostic results and the patients response to treatment, I feel that the patient would benefit from discharge and outpatient therapy as described.     Final diagnoses:  RLQ abdominal pain    ED Discharge Orders     None          Janetta Terrall FALCON, PA-C 05/31/24 2325    Simon Lavonia SAILOR, MD 06/01/24 [phone]

## 2024-05-31 NOTE — Discharge Instructions (Signed)
 It was a pleasure taking care of you today.  Based on your history, physical exam, labs, and imaging I feel you are stable for discharge.  Today no cause for your right lower quadrant abdominal pain was found.  Please continue to monitor your symptoms, if pain worsens, you develop fever, chills, excessive nausea, vomiting, or other concerning symptom please return to the emergency department.  I do recommend that you follow-up with your OB/GYN provider within the next 1 to 2 weeks and please make them aware of your visit today and all findings.  You may take over-the-counter Tylenol  and ibuprofen  to help with pain. Please do not exceed the max daily limit of tylenol  which is 4000mg /day or more than 1000mg  in a single dose. Please do not take more than 6-800mg  of ibuprofen  in a single dose.

## 2024-05-31 NOTE — ED Triage Notes (Signed)
 Pt reports with RLQ pain since this morning. Pt states that she was sent here by UC.

## 2024-07-12 ENCOUNTER — Encounter: Payer: Self-pay | Admitting: Internal Medicine

## 2024-07-12 ENCOUNTER — Other Ambulatory Visit: Payer: Self-pay | Admitting: Internal Medicine

## 2024-07-12 MED ORDER — FLUCONAZOLE 150 MG PO TABS: ORAL_TABLET | ORAL | 0 refills | Status: AC

## 2024-09-16 ENCOUNTER — Encounter: Payer: 59 | Admitting: Internal Medicine
